# Patient Record
Sex: Female | Born: 1975
Health system: Southern US, Community
[De-identification: ages and names within clinical notes are randomized; demographics above are authoritative.]

## PROBLEM LIST (undated history)

## (undated) DIAGNOSIS — M2550 Pain in unspecified joint: Secondary | ICD-10-CM

## (undated) DIAGNOSIS — B001 Herpesviral vesicular dermatitis: Secondary | ICD-10-CM

## (undated) DIAGNOSIS — R2242 Localized swelling, mass and lump, left lower limb: Secondary | ICD-10-CM

## (undated) DIAGNOSIS — N939 Abnormal uterine and vaginal bleeding, unspecified: Secondary | ICD-10-CM

## (undated) DIAGNOSIS — R5383 Other fatigue: Secondary | ICD-10-CM

## (undated) DIAGNOSIS — M7989 Other specified soft tissue disorders: Secondary | ICD-10-CM

## (undated) DIAGNOSIS — R0602 Shortness of breath: Secondary | ICD-10-CM

## (undated) HISTORY — PX: ANKLE SURGERY: SHX546

## (undated) HISTORY — DX: Other specified soft tissue disorders: M79.89

## (undated) HISTORY — DX: Abnormal uterine and vaginal bleeding, unspecified: N93.9

## (undated) HISTORY — DX: Shortness of breath: R06.02

## (undated) HISTORY — DX: Localized swelling, mass and lump, left lower limb: R22.42

## (undated) HISTORY — DX: Herpesviral vesicular dermatitis: B00.1

## (undated) HISTORY — DX: Pain in unspecified joint: M25.50

## (undated) HISTORY — DX: Other fatigue: R53.83

---

## 2014-11-24 ENCOUNTER — Encounter: Payer: Self-pay | Admitting: Obstetrics and Gynecology

## 2014-12-02 ENCOUNTER — Ambulatory Visit (INDEPENDENT_AMBULATORY_CARE_PROVIDER_SITE_OTHER): Payer: BLUE CROSS/BLUE SHIELD | Admitting: Obstetrics and Gynecology

## 2014-12-02 ENCOUNTER — Encounter: Payer: Self-pay | Admitting: Obstetrics and Gynecology

## 2014-12-02 VITALS — BP 100/58 | HR 80 | Resp 16 | Ht 61.5 in | Wt 218.0 lb

## 2014-12-02 DIAGNOSIS — N939 Abnormal uterine and vaginal bleeding, unspecified: Secondary | ICD-10-CM | POA: Diagnosis not present

## 2014-12-02 DIAGNOSIS — Z124 Encounter for screening for malignant neoplasm of cervix: Secondary | ICD-10-CM

## 2014-12-02 DIAGNOSIS — Z23 Encounter for immunization: Secondary | ICD-10-CM

## 2014-12-02 DIAGNOSIS — E663 Overweight: Secondary | ICD-10-CM | POA: Diagnosis not present

## 2014-12-02 DIAGNOSIS — Z01419 Encounter for gynecological examination (general) (routine) without abnormal findings: Secondary | ICD-10-CM

## 2014-12-02 DIAGNOSIS — R5383 Other fatigue: Secondary | ICD-10-CM | POA: Diagnosis not present

## 2014-12-02 DIAGNOSIS — Z0000 Encounter for general adult medical examination without abnormal findings: Secondary | ICD-10-CM | POA: Diagnosis not present

## 2014-12-02 DIAGNOSIS — N926 Irregular menstruation, unspecified: Secondary | ICD-10-CM | POA: Diagnosis not present

## 2014-12-02 LAB — POCT URINE PREGNANCY: Preg Test, Ur: NEGATIVE

## 2014-12-02 MED ORDER — MEDROXYPROGESTERONE ACETATE 5 MG PO TABS: 5.0000 mg | ORAL_TABLET | Freq: Every day | ORAL | Status: DC

## 2014-12-02 NOTE — Progress Notes (Signed)
Patient ID: Caroline Heath, female   DOB: [birth year], 39 y.o.   MRN: [id number] 39 y.o. [id number] MarriedCaucasianF here for annual exam.  She has had irregular menses for the last 1-2 years. Cycles q 4-8 weeks x 4-14 days. Half the time she can bleed for 2 weeks. Typically goes through 4 maxi-pads a day. At times she has gone through a pad in 30 minutes. Occasional intermenstrual spotting, has happened for up to 2 weeks. Sexually active, vasectomy for contraception. No dyspareunia.   Period Duration (Days): 4-5 days  - sometimes it last 2 weeks Period Pattern: (!) Irregular Menstrual Flow: Heavy, Moderate Menstrual Control: Maxi pad Dysmenorrhea: None  Patient's last menstrual period was 09/20/2014.          Sexually active: Yes.    The current method of family planning is none.    Exercising: No.  The patient does not participate in regular exercise at present. Walks a couple of times a week.  Smoker:  no  Health Maintenance: Pap:  6 years ago per patient  History of abnormal Pap:  Yes  -repeat PAP - normal  MMG:  Never Colonoscopy:  Never BMD:   Never TDaP:  unsure Gardasil: N/A   reports that she has never smoked. She has never used smokeless tobacco. She reports that she does not drink alcohol or use illicit drugs. She does medical payment posting. Kids are 20,18 and 15. Oldest is a Holiday representativejunior in Automotive engineercollege.   History reviewed. No pertinent past medical history.  Past Surgical History  Procedure Laterality Date  . Ankle surgery Right     No current outpatient prescriptions on file.   No current facility-administered medications for this visit.    Family History  Problem Relation Age of Onset  . Hypertension Mother   . Hypertension Father   . Diabetes Father   . Cancer Maternal Grandmother     oral cancer  . Heart disease Paternal Grandmother   . Heart disease Paternal Grandfather   . Leukemia Paternal Grandfather     Review of Systems  Constitutional: Negative.   HENT:  Negative.   Eyes: Negative.   Respiratory: Negative.   Cardiovascular: Negative.   Gastrointestinal: Negative.   Endocrine: Negative.   Genitourinary: Positive for menstrual problem.       Irregular menstrual cycles Heavy menstrual cycles   Musculoskeletal: Negative.   Skin: Negative.   Allergic/Immunologic: Negative.   Neurological: Negative.   Psychiatric/Behavioral: Negative.   +fatigue  Exam:   BP 100/58 mmHg  Pulse 80  Resp 16  Ht 5' 1.5" (1.562 m)  Wt 218 lb (98.884 kg)  BMI 40.53 kg/m2  LMP 09/20/2014  Weight change: @WEIGHTCHANGE @ Height:   Height: 5' 1.5" (156.2 cm)  Ht Readings from Last 3 Encounters:  12/02/14 5' 1.5" (1.562 m)    General appearance: alert, cooperative and appears stated age Head: Normocephalic, without obvious abnormality, atraumatic Neck: no adenopathy, supple, symmetrical, trachea midline and thyroid normal to inspection and palpation Lungs: clear to auscultation bilaterally Breasts: normal appearance, no masses or tenderness Heart: regular rate and rhythm Abdomen: soft, non-tender; bowel sounds normal; no masses,  no organomegaly Extremities: extremities normal, atraumatic, no cyanosis or edema Skin: Skin color, texture, turgor normal. No rashes or lesions Lymph nodes: Cervical, supraclavicular, and axillary nodes normal. No abnormal inguinal nodes palpated Neurologic: Grossly normal   Pelvic: External genitalia:  no lesions              Urethra:  normal appearing urethra  with no masses, tenderness or lesions              Bartholins and Skenes: normal                 Vagina: normal appearing vagina with normal color and discharge, no lesions              Cervix: no lesions               Bimanual Exam:  Uterus:  normal size, contour, position, consistency, mobility, non-tender and exam slightly limited by BMI              Adnexa: no mass, fullness, tenderness               Rectovaginal: Confirms               Anus:  normal sphincter  tone, no lesions  Chaperone was present for exam.  A:  Well Woman with normal exam  Fatigue  Abnormal uterine bleeding  Overweight  Contraception is vasectomy  P:   Pap with hpv  Labs, not fasting  Provera 5 mg x 5 days. It has been 2 months since her last cycle  Return for u/s, possible sonohysterogram, possible endometrial biopsy  Mammogram  Discussed weight watchers, exercise and the weight loss clinic

## 2014-12-02 NOTE — Patient Instructions (Signed)

## 2014-12-03 LAB — LIPID PANEL
CHOL/HDL RATIO: 3.6 ratio (ref <=5.0)
CHOLESTEROL: 195 mg/dL (ref 125–200)
HDL: 54 mg/dL (ref >=46)
LDL CALC: 125 mg/dL (ref <130)
Triglycerides: 82 mg/dL (ref <150)
VLDL: 16 mg/dL (ref <30)

## 2014-12-03 LAB — COMPREHENSIVE METABOLIC PANEL
ALT: 13 U/L (ref 6–29)
AST: 14 U/L (ref 10–30)
Albumin: 3.9 g/dL (ref 3.6–5.1)
Alkaline Phosphatase: 65 U/L (ref 33–115)
BILIRUBIN TOTAL: 0.4 mg/dL (ref 0.2–1.2)
BUN: 20 mg/dL (ref 7–25)
CALCIUM: 9.1 mg/dL (ref 8.6–10.2)
CO2: 26 mmol/L (ref 20–31)
CREATININE: 0.82 mg/dL (ref 0.50–1.10)
Chloride: 103 mmol/L (ref 98–110)
GLUCOSE: 71 mg/dL (ref 65–99)
Potassium: 3.9 mmol/L (ref 3.5–5.3)
SODIUM: 138 mmol/L (ref 135–146)
Total Protein: 6.8 g/dL (ref 6.1–8.1)

## 2014-12-03 LAB — CBC
HCT: 39.8 % (ref 36.0–46.0)
Hemoglobin: 12.8 g/dL (ref 12.0–15.0)
MCH: 28.3 pg (ref 26.0–34.0)
MCHC: 32.2 g/dL (ref 30.0–36.0)
MCV: 88.1 fL (ref 78.0–100.0)
MPV: 12.1 fL (ref 8.6–12.4)
PLATELETS: 270 10*3/uL (ref 150–400)
RBC: 4.52 MIL/uL (ref 3.87–5.11)
RDW: 14.3 % (ref 11.5–15.5)
WBC: 5.7 10*3/uL (ref 4.0–10.5)

## 2014-12-03 LAB — PROLACTIN: Prolactin: 5.8 ng/mL

## 2014-12-03 LAB — HEMOGLOBIN A1C
Hgb A1c MFr Bld: 5.6 % (ref <5.7)
Mean Plasma Glucose: 114 mg/dL (ref <117)

## 2014-12-03 LAB — TSH: TSH: 2.388 u[IU]/mL (ref 0.350–4.500)

## 2014-12-03 LAB — VITAMIN D 25 HYDROXY (VIT D DEFICIENCY, FRACTURES): VIT D 25 HYDROXY: 30 ng/mL (ref 30–100)

## 2014-12-06 LAB — IPS PAP TEST WITH HPV

## 2014-12-07 ENCOUNTER — Ambulatory Visit: Payer: BLUE CROSS/BLUE SHIELD | Admitting: Obstetrics and Gynecology

## 2014-12-21 ENCOUNTER — Encounter: Payer: Self-pay | Admitting: Obstetrics and Gynecology

## 2014-12-21 ENCOUNTER — Ambulatory Visit (INDEPENDENT_AMBULATORY_CARE_PROVIDER_SITE_OTHER): Payer: BLUE CROSS/BLUE SHIELD | Admitting: Obstetrics and Gynecology

## 2014-12-21 ENCOUNTER — Ambulatory Visit (INDEPENDENT_AMBULATORY_CARE_PROVIDER_SITE_OTHER): Payer: BLUE CROSS/BLUE SHIELD

## 2014-12-21 ENCOUNTER — Other Ambulatory Visit: Payer: Self-pay | Admitting: Obstetrics and Gynecology

## 2014-12-21 DIAGNOSIS — N939 Abnormal uterine and vaginal bleeding, unspecified: Secondary | ICD-10-CM

## 2014-12-21 NOTE — Addendum Note (Signed)
Addended by: Tobi BastosJERTSON, [name] E on: 12/21/2014 04:49 PM   Modules accepted: Orders

## 2014-12-21 NOTE — Patient Instructions (Signed)
Intrauterine Device Insertion Most often, an intrauterine device (IUD) is inserted into the uterus to prevent pregnancy. There are 2 types of IUDs available:  Copper IUD--This type of IUD creates an environment that is not favorable to sperm survival. The mechanism of action of the copper IUD is not known for certain. It can stay in place for 10 years.  Hormone IUD--This type of IUD contains the hormone progestin (synthetic progesterone). The progestin thickens the cervical mucus and prevents sperm from entering the uterus, and it also thins the uterine lining. There is no evidence that the hormone IUD prevents implantation. One hormone IUD can stay in place for up to 5 years, and a different hormone IUD can stay in place for up to 3 years. An IUD is the most cost-effective birth control if left in place for the full duration. It may be removed at any time. LET YOUR HEALTH CARE PROVIDER KNOW ABOUT:  Any allergies you have.  All medicines you are taking, including vitamins, herbs, eye drops, creams, and over-the-counter medicines.  Previous problems you or members of your family have had with the use of anesthetics.  Any blood disorders you have.  Previous surgeries you have had.  Possibility of pregnancy.  Medical conditions you have. RISKS AND COMPLICATIONS  Generally, intrauterine device insertion is a safe procedure. However, as with any procedure, complications can occur. Possible complications include:  Accidental puncture (perforation) of the uterus.  Accidental placement of the IUD either in the muscle layer of the uterus (myometrium) or outside the uterus. If this happens, the IUD can be found essentially floating around the bowels and must be taken out surgically.  The IUD may fall out of the uterus (expulsion). This is more common in women who have recently had a child.   Pregnancy in the fallopian tube (ectopic).  Pelvic inflammatory disease (PID), which is infection of  the uterus and fallopian tubes. The risk of PID is slightly increased in the first 20 days after the IUD is placed, but the overall risk is still very low. BEFORE THE PROCEDURE  Schedule the IUD insertion for when you will have your menstrual period or right after, to make sure you are not pregnant. Placement of the IUD is better tolerated shortly after a menstrual cycle.  You may need to take tests or be examined to make sure you are not pregnant.  You may be required to take a pregnancy test.  You may be required to get checked for sexually transmitted infections (STIs) prior to placement. Placing an IUD in someone who has an infection can make the infection worse.  You may be given a pain reliever to take 1 or 2 hours before the procedure.  An exam will be performed to determine the size and position of your uterus.  Ask your health care provider about changing or stopping your regular medicines. PROCEDURE   A tool (speculum) is placed in the vagina. This allows your health care provider to see the lower part of the uterus (cervix).  The cervix is prepped with a medicine that lowers the risk of infection.  You may be given a medicine to numb each side of the cervix (intracervical or paracervical block). This is used to block and control any discomfort with insertion.  A tool (uterine sound) is inserted into the uterus to determine the length of the uterine cavity and the direction the uterus may be tilted.  A slim instrument (IUD inserter) is inserted through the cervical   canal and into your uterus.  The IUD is placed in the uterine cavity and the insertion device is removed.  The nylon string that is attached to the IUD and used for eventual IUD removal is trimmed. It is trimmed so that it lays high in the vagina, just outside the cervix. AFTER THE PROCEDURE  You may have bleeding after the procedure. This is normal. It varies from light spotting for a few days to menstrual-like  bleeding.  You may have mild cramping.   This information is not intended to replace advice given to you by your health care provider. Make sure you discuss any questions you have with your health care provider.   Document Released: 08/30/2010 Document Revised: 10/22/2012 Document Reviewed: 06/22/2012 Elsevier Interactive Patient Education 2016 Elsevier Inc.  

## 2014-12-21 NOTE — Progress Notes (Signed)
GYNECOLOGY  VISIT   HPI: 39 y.o.   Married  Caucasian  female   [phone] with Patient's last menstrual period was 12/05/2014.   here for  [facility] for abnormal uterine bleeding, irregular and heavy. Normal CBC and TSH.   GYNECOLOGIC HISTORY: Patient's last menstrual period was 12/05/2014. Contraception:Vastectomy  Menopausal hormone therapy: None        OB History    Gravida Para Term Preterm AB TAB SAB Ectopic Multiple Living   There are no active problems to display for this patient.   History reviewed. No pertinent past medical history.  Past Surgical History  Procedure Laterality Date  . Ankle surgery Right     No current outpatient prescriptions on file.   No current facility-administered medications for this visit.     ALLERGIES: Review of patient's allergies indicates no known allergies.  Family History  Problem Relation Age of Onset  . Hypertension Mother   . Hypertension Father   . Diabetes Father   . Cancer Maternal Grandmother     oral cancer  . Heart disease Paternal Grandmother   . Heart disease Paternal Grandfather   . Leukemia Paternal Grandfather     Social History   Social History  . Marital Status: Married    Spouse Name: N/A  . Number of Children: N/A  . Years of Education: N/A   Occupational History  . Not on file.   Social History Main Topics  . Smoking status: Never Smoker   . Smokeless tobacco: Never Used  . Alcohol Use: No  . Drug Use: No  . Sexual Activity:    Partners: Male   Other Topics Concern  . Not on file   Social History Narrative    Review of Systems  Genitourinary:       Abnormal uterine bleeding   All other systems reviewed and are negative.   PHYSICAL EXAMINATION:    BP 110/70 mmHg  Pulse 80  Resp 14  Wt 221 lb (100.245 kg)  LMP 12/05/2014    General appearance: alert, cooperative and appears stated age  Pelvic: External genitalia:  no lesions  Urethra:  normal appearing urethra with no masses, tenderness or lesions              Bartholins and Skenes: normal                 Vagina: normal appearing vagina with normal color and discharge, no lesions              Cervix: no lesions                 Sonohysterogram The procedure and risks of the procedure were reviewed with the patient, consent form was signed. A speculum was placed in the vagina and the cervix was cleansed with betadine. The sonohysterogram catheter was inserted into the uterine cavity without difficulty. Saline was infused under direct observation with the ultrasound. No intracavitary defects were noted.The catheter was removed.   The risks of endometrial biopsy were reviewed and a consent was obtained.  A speculum was placed in the vagina and the cervix was cleansed with betadine. The mini-pipelle was placed into the endometrial cavity. The uterus sounded to 9 cm. The endometrial biopsy was performed, moderate tissue was obtained. The tenaculum and speculum were removed. There were no complications.   Chaperone was present for  exam.  ASSESSMENT Abnormal uterine bleeding, normal CBC, TSH     PLAN Endometrial biopsy done Discussed OCP's and the mirena IUD, risks and side effects reviewed. No contraindications. She is thinking about the mirena, information given If she decides on the Mirena she can do it any time in her cycle, vasectomy for contraception   An After Visit Summary was printed and given to the patient.  In addition to the sonohysterogram and biopsy, 15 minutes face to face time of which over 50% was spent in counseling.

## 2014-12-23 ENCOUNTER — Encounter: Payer: Self-pay | Admitting: Obstetrics and Gynecology

## 2014-12-27 ENCOUNTER — Telehealth: Payer: Self-pay | Admitting: Emergency Medicine

## 2014-12-27 NOTE — Telephone Encounter (Signed)
Patient sent two mychart messages.  Responded to patient in message.  Telephone encounter created and sent to Dr. Oscar LaJertson.

## 2014-12-27 NOTE — Telephone Encounter (Signed)
Mychart message to Dr. Oscar LaJertson with patient request to start on OCP.

## 2014-12-27 NOTE — Telephone Encounter (Signed)
Chief Complaint  Patient presents with  . Medication Management    Patient sent mychart message    ===View-only below this line===   ----- Message -----    From: Jenene SlickerGANTT,Hue    Sent: 12/23/2014 10:37 AM EST      To: Romualdo BolkJill Evelyn Jertson, MD Subject: Visit Follow-Up Question  Dr. Oscar LaJertson said that she could prescribe me a birth control pill to help with my periods.  I would like to try some if she could call in to [facility].  Thanks!  Also if there are birth control that help with weight loss, if I could get that prescribed and not one that will make you gain weight. Thanks!

## 2014-12-28 MED ORDER — NORETHIN ACE-ETH ESTRAD-FE 1-20 MG-MCG PO TABS: 1.0000 | ORAL_TABLET | Freq: Every day | ORAL | Status: DC

## 2014-12-28 NOTE — Telephone Encounter (Signed)
Message left to return call to Lynesha Bango at [phone].    

## 2014-12-28 NOTE — Telephone Encounter (Signed)
I've sent in a 3 month script for Loestrin 1/20 for her. Please tell her as many women loose weight as gain weight on the low dose pills. Please set up a f/u appointment with me in 3 months. She should start the pill on the first day of her cycle.

## 2015-01-18 NOTE — Telephone Encounter (Signed)
Message left to return call to Tracy at [phone].    

## 2015-03-08 ENCOUNTER — Telehealth: Payer: Self-pay | Admitting: Emergency Medicine

## 2015-03-08 NOTE — Telephone Encounter (Signed)
Call to patient. She was to schedule 3 month follow up with Dr. Oscar La after starting [facility]  Patient states she has not started pills and was not aware that they were called in to her.  I advised her that I attempted to reach her multiple times after rx sent and was unable to reach her.  Patient would like to start pills now and aware of instructions. She will call back to schedule follow up or with any concerns.. Vasectomy for contraception.

## 2015-03-29 ENCOUNTER — Other Ambulatory Visit: Payer: Self-pay | Admitting: Obstetrics and Gynecology

## 2015-03-29 ENCOUNTER — Telehealth: Payer: Self-pay

## 2015-03-29 ENCOUNTER — Encounter: Payer: Self-pay | Admitting: Obstetrics and Gynecology

## 2015-03-29 ENCOUNTER — Other Ambulatory Visit: Payer: Self-pay | Admitting: Obstetrics & Gynecology

## 2015-03-29 MED ORDER — NORETHIN ACE-ETH ESTRAD-FE 1-20 MG-MCG PO TABS: 1.0000 | ORAL_TABLET | Freq: Every day | ORAL | Status: DC

## 2015-03-29 NOTE — Telephone Encounter (Signed)
Visit Follow-Up Question  Message [phone]   From  Jenene Slickeronya Pautler   To  Romualdo BolkJill Evelyn Jertson, MD   Sent  03/29/2015 5:00 PM     I never filled the birth control pill, I didn't know it was called in. The pharmacy does not have it. I was wanting to know if you could call in again for me to [facility]. I spoke with last week and she said if the pharmacy didn't have it to let you know. Thanks! Caroline Heath      Responsible Party    Pool - [facility] Pool No one has taken responsibility for this message.     No actions have been taken on this message.     Rx for Loestrin Fe 1-20 on 12/28/2014. Patient states that she was unaware that this rx was sent in to the pharmacy. Per note from Almedia Ballsracy Fast, RN on 03/08/2015 and 12/27/2014 she tried to reach patient multiple times to notify her this rx had been sent in without a return call. Is it okay to send in 3 month supply of Loestrin Fe 1-20 to pharmacy on file for patient at this time?

## 2015-03-29 NOTE — Telephone Encounter (Signed)
Telephone encounter created to discuss mychart message with Dr.Jertson. 

## 2015-03-29 NOTE — Telephone Encounter (Signed)
Rx sent electronically with message to pt.  Ok to close encounter.

## 2015-10-03 ENCOUNTER — Telehealth: Payer: Self-pay | Admitting: Obstetrics and Gynecology

## 2015-10-03 NOTE — Telephone Encounter (Signed)
Patient has been on her cycle for three weeks and is asking if she could get a prescription to help with this issue?

## 2015-10-03 NOTE — Telephone Encounter (Signed)
I agree

## 2015-10-03 NOTE — Telephone Encounter (Signed)
Left message to call Artemisia Auvil at [phone].  

## 2015-10-03 NOTE — Telephone Encounter (Signed)
Spoke with patient.Patient reports cycle started 3 weeks, 2 days ago. Patient states bleeding will "ease up" gets "worse" but never light. Patient states multiple clots throughout cycle.  Denies cramping. Patient states she is changing a heavy flow pad every 2 hours. Patient reports no longer taking OCP. Advised patient she would need to come into office for further evaluation with Dr. Oscar LaJertson. Patient scheduled for 8:15am, 10/04/15 with Dr. Oscar LaJertson. Last seen in office for AUB 12/21/2014. AEX scheduled for 12/07/15. Patient agreeable to date and time.   Dr. Oscar LaJertson, do you agree with these recommendations?

## 2015-10-04 ENCOUNTER — Encounter: Payer: Self-pay | Admitting: Obstetrics and Gynecology

## 2015-10-04 ENCOUNTER — Telehealth: Payer: Self-pay | Admitting: Obstetrics and Gynecology

## 2015-10-04 ENCOUNTER — Ambulatory Visit (INDEPENDENT_AMBULATORY_CARE_PROVIDER_SITE_OTHER): Payer: BLUE CROSS/BLUE SHIELD | Admitting: Obstetrics and Gynecology

## 2015-10-04 VITALS — BP 100/68 | HR 76 | Resp 14 | Wt 229.0 lb

## 2015-10-04 DIAGNOSIS — N858 Other specified noninflammatory disorders of uterus: Secondary | ICD-10-CM | POA: Diagnosis not present

## 2015-10-04 DIAGNOSIS — N939 Abnormal uterine and vaginal bleeding, unspecified: Secondary | ICD-10-CM

## 2015-10-04 LAB — CBC
HEMATOCRIT: 36.4 % (ref 35.0–45.0)
HEMOGLOBIN: 11.8 g/dL (ref 11.7–15.5)
MCH: 28.4 pg (ref 27.0–33.0)
MCHC: 32.4 g/dL (ref 32.0–36.0)
MCV: 87.5 fL (ref 80.0–100.0)
MPV: 11.6 fL (ref 7.5–12.5)
Platelets: 262 10*3/uL (ref 140–400)
RBC: 4.16 MIL/uL (ref 3.80–5.10)
RDW: 14 % (ref 11.0–15.0)
WBC: 5.6 10*3/uL (ref 3.8–10.8)

## 2015-10-04 LAB — POCT URINE PREGNANCY: Preg Test, Ur: NEGATIVE

## 2015-10-04 LAB — FERRITIN: Ferritin: 20 ng/mL (ref 10–232)

## 2015-10-04 LAB — TSH: TSH: 2.73 mIU/L

## 2015-10-04 MED ORDER — MEDROXYPROGESTERONE ACETATE 10 MG PO TABS: ORAL_TABLET | ORAL | 0 refills | Status: DC

## 2015-10-04 MED ORDER — NORETHIN ACE-ETH ESTRAD-FE 1-20 MG-MCG PO TABS: ORAL_TABLET | ORAL | 0 refills | Status: DC

## 2015-10-04 NOTE — Telephone Encounter (Signed)
Spoke with Talbert ForestShirley at [facility]. Clarified direction for Provera 10 mg 1 tab po BID until bleeding stops, then 1 tablet po qd x 10 days #20 0RF. Talbert ForestShirley is agreeable and will fil rx at this time.  Routing to provider for final review. Patient agreeable to disposition. Will close encounter.

## 2015-10-04 NOTE — Telephone Encounter (Signed)
Patient's pharmacy called for clarification on Provera instructions.

## 2015-10-04 NOTE — Patient Instructions (Addendum)
Endometrial Biopsy Post-procedure Instructions . Cramping is common.  You may take Ibuprofen, Aleve, or Tylenol for the cramping.  This should resolve within 24 hours.   . You may have a small amount of spotting.  You should wear a mini pad for the next few days. . You may have intercourse in 24 hours. . You need to call the office if you have any pelvic pain, fever, heavy bleeding, or foul smelling vaginal discharge. . Shower or bathe as normal . You will be notified within one week of your biopsy results or we will discuss your results at your follow-up appointment if needed.   Oral Contraception Information Oral contraceptive pills (OCPs) are medicines taken to prevent pregnancy. OCPs work by preventing the ovaries from releasing eggs. The hormones in OCPs also cause the cervical mucus to thicken, preventing the sperm from entering the uterus. The hormones also cause the uterine lining to become thin, not allowing a fertilized egg to attach to the inside of the uterus. OCPs are highly effective when taken exactly as prescribed. However, OCPs do not prevent sexually transmitted diseases (STDs). Safe sex practices, such as using condoms along with the pill, can help prevent STDs.  Before taking the pill, you may have a physical exam and Pap test. Your health care provider may order blood tests. The health care provider will make sure you are a good candidate for oral contraception. Discuss with your health care provider the possible side effects of the OCP you may be prescribed. When starting an OCP, it can take 2 to 3 months for the body to adjust to the changes in hormone levels in your body.  TYPES OF ORAL CONTRACEPTION  The combination pill--This pill contains estrogen and progestin (synthetic progesterone) hormones. The combination pill comes in 21-day, 28-day, or 91-day packs. Some types of combination pills are meant to be taken continuously (365-day pills). With 21-day packs, you do not take  pills for 7 days after the last pill. With 28-day packs, the pill is taken every day. The last 7 pills are without hormones. Certain types of pills have more than 21 hormone-containing pills. With 91-day packs, the first 84 pills contain both hormones, and the last 7 pills contain no hormones or contain estrogen only.  The minipill--This pill contains the progesterone hormone only. The pill is taken every day continuously. It is very important to take the pill at the same time each day. The minipill comes in packs of 28 pills. All 28 pills contain the hormone.  ADVANTAGES OF ORAL CONTRACEPTIVE PILLS  Decreases premenstrual symptoms.   Treats menstrual period cramps.   Regulates the menstrual cycle.   Decreases a heavy menstrual flow.   May treatacne, depending on the type of pill.   Treats abnormal uterine bleeding.   Treats polycystic ovarian syndrome.   Treats endometriosis.   Can be used as emergency contraception.  THINGS THAT CAN MAKE ORAL CONTRACEPTIVE PILLS LESS EFFECTIVE OCPs can be less effective if:   You forget to take the pill at the same time every day.   You have a stomach or intestinal disease that lessens the absorption of the pill.   You take OCPs with other medicines that make OCPs less effective, such as antibiotics, certain HIV medicines, and some seizure medicines.   You take expired OCPs.   You forget to restart the pill on day 7, when using the packs of 21 pills.  RISKS ASSOCIATED WITH ORAL CONTRACEPTIVE PILLS  Oral contraceptive pills  can sometimes cause side effects, such as:  Headache.  Nausea.  Breast tenderness.  Irregular bleeding or spotting. Combination pills are also associated with a small increased risk of:  Blood clots.  Heart attack.  Stroke.   This information is not intended to replace advice given to you by your health care provider. Make sure you discuss any questions you have with your health care provider.    Document Released: 03/24/2002 Document Revised: 10/22/2012 Document Reviewed: 06/22/2012 Elsevier Interactive Patient Education [employer]

## 2015-10-04 NOTE — Progress Notes (Signed)
GYNECOLOGY  VISIT   HPI: 40 y.o.   Married  Caucasian  female   [phone] with Patient's last menstrual period was 09/11/2015.   here c/o  Irregular menstrual bleeding.  The patient was evaluated in 12/16 for irregular heavy cycles. She had a negative biopsy, normal sonohysterogram, normal TSH and CBC. At that time we discussed OCP's and the mirena IUD (vasectomy for contraception). Menses typically q month, can skip a cycle. She skipped July. Then she had one the end of July, lasted for a week (normal). She started bleeding August 27 and is still bleeding. Heavy every day. She is saturating a pad every 2 hours (thick ones). Not light headed or dizzy. Just occasional mild cramps. She is gaining weight, up 8 lbs in 6 months.   GYNECOLOGIC HISTORY: Patient's last menstrual period was 09/11/2015. Contraception:Vasectomy  Menopausal hormone therapy: None        OB History    Gravida Para Term Preterm AB Living   3 3 3     3    SAB TAB Ectopic Multiple Live Births           3         There are no active problems to display for this patient.   Past Medical History:  Diagnosis Date  . Abnormal uterine bleeding     Past Surgical History:  Procedure Laterality Date  . ANKLE SURGERY Right     No current outpatient prescriptions on file.   No current facility-administered medications for this visit.      ALLERGIES: Review of patient's allergies indicates no known allergies.  Family History  Problem Relation Age of Onset  . Hypertension Mother   . Hypertension Father   . Diabetes Father   . Cancer Maternal Grandmother     oral cancer  . Heart disease Paternal Grandmother   . Heart disease Paternal Grandfather   . Leukemia Paternal Grandfather     Social History   Social History  . Marital status: Married    Spouse name: N/A  . Number of children: N/A  . Years of education: N/A   Occupational History  . Not on file.   Social History Main Topics  . Smoking status: Never  Smoker  . Smokeless tobacco: Never Used  . Alcohol use No  . Drug use: No  . Sexual activity: Yes    Partners: Male   Other Topics Concern  . Not on file   Social History Narrative  . No narrative on file    Review of Systems  Constitutional: Negative.   HENT: Negative.   Eyes: Negative.   Respiratory: Negative.   Cardiovascular: Negative.   Gastrointestinal: Negative.   Genitourinary:       Menstrual problem Irregular bleeding   Musculoskeletal: Negative.   Skin: Negative.   Neurological: Negative.   Endo/Heme/Allergies: Negative.   Psychiatric/Behavioral: Negative.     PHYSICAL EXAMINATION:    BP 100/68 (BP Location: Right Arm, Patient Position: Sitting, Cuff Size: Large)   Pulse 76   Resp 14   Wt 229 lb (103.9 kg)   LMP 09/11/2015   BMI 42.57 kg/m     General appearance: alert, cooperative and appears stated age Neck: no adenopathy, supple, symmetrical, trachea midline and thyroid normal to inspection and palpation Abdomen: soft, non-tender; bowel sounds normal; no masses,  no organomegaly  Pelvic: External genitalia:  no lesions              Urethra:  normal appearing  urethra with no masses, tenderness or lesions              Bartholins and Skenes: normal                 Vagina: normal appearing vagina with normal color and discharge, no lesions              Cervix: no lesions              Bimanual Exam:  Uterus:  normal size, contour, position, consistency, mobility, non-tender              Adnexa: no mass, fullness, tenderness               The risks of endometrial biopsy were reviewed and a consent was obtained.  A speculum was placed in the vagina and the cervix was cleansed with betadine. The uterine evacuator was placed into the endometrial cavity. The uterus sounded to 8 cm. The endometrial curettagey was performed, a large amount of tissue was obtained (approximately 10 cc of material). The speculum was removed. There were no complications.     Chaperone was present for exam.  ASSESSMENT Abnormal uterine bleeding. Bleeding for the last 3.5 weeks, suspect anovulatory bleeding.     PLAN UPT negative [facility] CBC, Ferritin, TSH Start provera 10 mg x 10 days Would recommend she return for a mirena IUD or start on OCP's After discussion would like to start on OCP's, no contraindications, risks reviewed.    An After Visit Summary was printed and given to the patient.  25 minutes face to face time of which over 50% was spent in counseling.

## 2015-10-19 ENCOUNTER — Encounter: Payer: Self-pay | Admitting: Obstetrics and Gynecology

## 2015-10-20 ENCOUNTER — Telehealth: Payer: Self-pay

## 2015-10-20 NOTE — Telephone Encounter (Signed)
Telephone encounter created to discuss MyChart message with patient. Sent to Dr.Jertson for review. Please see encounter dated 10/20/2015.

## 2015-10-20 NOTE — Telephone Encounter (Signed)
Spoke with patient. Advised of message as seen below form Dr.Jertson. Patient is agreeable and verbalizes understanding.  Routing to provider for final review. Patient agreeable to disposition. Will close encounter.  

## 2015-10-20 NOTE — Telephone Encounter (Signed)
Spoke with patient regarding MyChart message as seen below. Patient was seen in the office on 10/04/2015 for AUB and was started on Provera 10 mg x 10 days. Patient reports she took provera 10 mg daily for 10 days and her bleeding did not stop. On day 11 she started taking Loestrin Fe 1-20. Patient is in her first week of her pill pack and is still having bleeding. Reports she is changing her pad twice per day and is intermittently passing clots. Denies any SOB, fatigue, or dizziness. "It is a lot better than it was, but I am still having bleeding." Patient is asking when she should expect to have some relief or if she needs to be doing anything additional. Advised I will speak with Dr.Jertson and return call with further recommendations. Patient is agreeable.   Visit Follow-Up Question  Message [phone]  From Jenene Slickeronya Sigel To Romualdo BolkJill Evelyn Jertson, MD Sent 10/19/2015 12:27 PM  I took the Provera for 10 days I never stopped bleeding, so on the 11th day I started the birth control, I am still bleeding and passing clots. It has eased up but I still have to wear a pad everyday. When do you think I should get some relief?   Responsible Party   Pool - [facility] No one has taken responsibility for this message.  No actions have been taken on this message.

## 2015-10-20 NOTE — Telephone Encounter (Signed)
Her bleeding should get lighter. She may continue to have some bleeding this pill pack and may have some irregular bleeding on the next 2 packs. Tell her to take her pills at the same time every day, call if her bleeding is getting heavy, and to hang in there, it will get better.

## 2015-11-01 ENCOUNTER — Telehealth: Payer: Self-pay | Admitting: *Deleted

## 2015-11-01 ENCOUNTER — Encounter: Payer: Self-pay | Admitting: Obstetrics and Gynecology

## 2015-11-01 NOTE — Telephone Encounter (Signed)
Left message to call Caroline LarssonJill at [phone].      From Caroline Slickeronya Heath To Caroline BolkJill Evelyn Jertson, MD Sent 11/01/2015 11:48 AM  Dr. Oscar LaJertson ask me to message back if my period got heavier. It is like a normal period, passing more clots and changing pads every hour. It is not as heavy as when I saw her but it is like a normal period, I am still taking the birth control pills at the same time everyday. Is there anything else that can be done?

## 2015-11-02 NOTE — Telephone Encounter (Signed)
Spoke with patient. Patient states "bleeding has slowed down since last telephone call".  Patient states changing pad q2h. Patient states bleeding is heavy, but not as much as "initial visit" dated 10/04/15. Patient declined appointment for today and this week due to work. Patient states she can not come in until next week. Patient scheduled for 11/07/15 at 8:15 am with Dr. Oscar LaJertson. Advised should bleeding become heavier, SHOB, fatigue, dizziness; patient to return call to office or [facility]. Advised patient would review with Dr. Oscar LaJertson for additional recommendations and return call. Patient verbalizes understanding and is agreeable.     Dr. Oscar LaJertson, any additional recommendations?

## 2015-11-02 NOTE — Telephone Encounter (Signed)
Patient returned call

## 2015-11-02 NOTE — Telephone Encounter (Signed)
Please check where she is in her pill pack, she should be towards the end of her pack.  I agree with your advice.  She was supposed to be on the provera for 10 days without bleeding, but never stopped bleeding. Typically after finishing the provera a cycle will start and then you start the pill. She is still bleeding out from her previously thickened lining. It should improve on her second pack of pills.

## 2015-11-02 NOTE — Telephone Encounter (Signed)
Left message to call Brinae Woods at [phone].  

## 2015-11-02 NOTE — Telephone Encounter (Signed)
Spoke with patient. Returning call in reference to MyChart message as seen below. Patient states she has been bleeding for 2 months. Patient states she initially took Provera 10mg  for 10 days, then switched to Loestrin Fe 1-20 (see telephone encounter dated 10/20/15) patient states she has been taking daily at the same time every day. Patient states she is changing pad every 45 min. Denies fatigue, SHOB, or dizziness. Patient reports mild abdominal cramping that is relieved with Midol. Advised patient she would need to come in for OV for further evaluation. Offered multiple appointment times for today, patient states she will need to see if she can come today, will return call to office.

## 2015-11-03 NOTE — Telephone Encounter (Signed)
Spoke with patient. Patient states she is in week 3 of pills. Advised of Dr. Salli QuarryJertson's recommendations as seen below. Patient expressed her frustration and  states she just wants bleeding to stop now after 2 months. Patient will keep 11/07/15 appt with Dr. Oscar LaJertson. Advised to call if symptoms worsen or for questions.    Routing to provider for final review. Patient is agreeable to disposition. Will close encounter.

## 2015-11-07 ENCOUNTER — Ambulatory Visit (INDEPENDENT_AMBULATORY_CARE_PROVIDER_SITE_OTHER): Payer: BLUE CROSS/BLUE SHIELD | Admitting: Obstetrics and Gynecology

## 2015-11-07 ENCOUNTER — Encounter: Payer: Self-pay | Admitting: Obstetrics and Gynecology

## 2015-11-07 VITALS — BP 110/70 | HR 72 | Resp 16 | Wt 228.0 lb

## 2015-11-07 DIAGNOSIS — N939 Abnormal uterine and vaginal bleeding, unspecified: Secondary | ICD-10-CM | POA: Diagnosis not present

## 2015-11-07 DIAGNOSIS — N858 Other specified noninflammatory disorders of uterus: Secondary | ICD-10-CM | POA: Diagnosis not present

## 2015-11-07 LAB — CBC
HEMATOCRIT: 36.2 % (ref 35.0–45.0)
Hemoglobin: 11.5 g/dL — ABNORMAL LOW (ref 11.7–15.5)
MCH: 28 pg (ref 27.0–33.0)
MCHC: 31.8 g/dL — ABNORMAL LOW (ref 32.0–36.0)
MCV: 88.3 fL (ref 80.0–100.0)
MPV: 12.1 fL (ref 7.5–12.5)
Platelets: 279 10*3/uL (ref 140–400)
RBC: 4.1 MIL/uL (ref 3.80–5.10)
RDW: 14 % (ref 11.0–15.0)
WBC: 6.5 10*3/uL (ref 3.8–10.8)

## 2015-11-07 LAB — FERRITIN: FERRITIN: 12 ng/mL (ref 10–232)

## 2015-11-07 LAB — TSH: TSH: 3.47 mIU/L

## 2015-11-07 NOTE — Progress Notes (Signed)
GYNECOLOGY  VISIT   HPI: 40 y.o.   Married  Caucasian  female   [phone] with Patient's last menstrual period was 09/02/2015.   here c/o menstrual bleeding X 2 months In 12/16 the patient was evaluated for abnormal bleeding and had a negative endometrial biopsy, a normal sonohysterogram and a normal TSH and CBC. She declined treatment.  In 9/17 she was seen with what was c/w an anovulatory bleed.  Endometrial biopsy showed proliferative endometrium with breakdown, CBC was normal, TSH was normal. Negative UPT. She was treated with provera, bleeding got down to spotting with the provera, but never stopped. She started the pill the first day she stopped the provera and never had a w/d bleed. She was having spotting to light bleeding for the first several weeks on the pill. Then the end of last week her bleeding started getting heavier, over the weekend was very heavy.  At the worst she saturated a pad in 45 minutes. Passing big clots. Saturated through her clothes. Not light headed or dizzy. Having mild cramps.       GYNECOLOGIC HISTORY: Patient's last menstrual period was 09/02/2015. Contraception:OCP Menopausal hormone therapy: none         OB History    Gravida Para Term Preterm AB Living   3 3 3     3    SAB TAB Ectopic Multiple Live Births           3         There are no active problems to display for this patient.   Past Medical History:  Diagnosis Date  . Abnormal uterine bleeding     Past Surgical History:  Procedure Laterality Date  . ANKLE SURGERY Right     Current Outpatient Prescriptions  Medication Sig Dispense Refill  . norethindrone-ethinyl estradiol (JUNEL FE,GILDESS FE,LOESTRIN FE) 1-20 MG-MCG tablet Start on the first day of bleeding after finishing the provera 3 Package 0  . medroxyPROGESTERone (PROVERA) 10 MG tablet 1 tab po BID until bleeding stops, the 1 tablet po qd x 10 days. (Patient not taking: Reported on 11/07/2015) 20 tablet 0   No current  facility-administered medications for this visit.      ALLERGIES: Review of patient's allergies indicates no known allergies.  Family History  Problem Relation Age of Onset  . Hypertension Mother   . Hypertension Father   . Diabetes Father   . Cancer Maternal Grandmother     oral cancer  . Heart disease Paternal Grandmother   . Heart disease Paternal Grandfather   . Leukemia Paternal Grandfather     Social History   Social History  . Marital status: Married    Spouse name: N/A  . Number of children: N/A  . Years of education: N/A   Occupational History  . Not on file.   Social History Main Topics  . Smoking status: Never Smoker  . Smokeless tobacco: Never Used  . Alcohol use No  . Drug use: No  . Sexual activity: Yes    Partners: Male   Other Topics Concern  . Not on file   Social History Narrative  . No narrative on file    Review of Systems  Constitutional: Negative.   HENT: Negative.   Eyes: Negative.   Respiratory: Negative.   Cardiovascular: Negative.   Gastrointestinal: Negative.   Genitourinary:       Excessive menstrual bleeding   Musculoskeletal: Negative.   Skin: Negative.   Neurological: Negative.   Endo/Heme/Allergies: Negative.  Psychiatric/Behavioral: Negative.     PHYSICAL EXAMINATION:    BP 110/70 (BP Location: Right Arm, Patient Position: Sitting, Cuff Size: Large)   Pulse 72   Resp 16   Wt 228 lb (103.4 kg)   LMP 09/02/2015   BMI 42.38 kg/m     General appearance: alert, cooperative and appears stated age Abdomen: soft, non-tender; non distended, no masses,  no organomegaly  Pelvic: External genitalia:  no lesions              Urethra:  normal appearing urethra with no masses, tenderness or lesions              Bartholins and Skenes: normal                 Vagina: normal appearing vagina with normal color and discharge, no lesions, large amount of blood in the vagina, on the chucks.               Cervix: no lesions,  actively bleeding              Bimanual Exam:  Uterus:  normal size, contour, position, consistency, mobility, non-tender              Adnexa: no mass, fullness, tenderness   The risks of endometrial biopsy were reviewed and a consent was obtained.  A speculum was placed in the vagina and the cervix was cleansed with hibacleans. A tenaculum was placed on the cervix and the uterine evacuator was placed into the endometrial cavity. The uterus sounded to 10 cm. The endometrial curettage was performed, a large amount of tissue was obtained. The tenaculum and speculum were removed. There were no complications.                 Chaperone was present for exam.  ASSESSMENT Abnormal uterine bleeding, c/w anovulatory bleed. Very heavy over the weekend and today She was treated with provera, never really had a w/d bleed secondary to starting OCP's right away    PLAN Office endometrial curettage CBC, Ferritin, TSH Continue OCP's   An After Visit Summary was printed and given to the patient.  20 minutes face to face time of which over 50% was spent in counseling.

## 2015-11-09 ENCOUNTER — Telehealth: Payer: Self-pay | Admitting: *Deleted

## 2015-11-09 NOTE — Telephone Encounter (Signed)
Left message to call regarding results -eh 

## 2015-11-09 NOTE — Telephone Encounter (Signed)
-----   Message from Romualdo BolkJill Evelyn Jertson, MD sent at 11/08/2015  6:36 PM EDT ----- Please inform the patient that her biopsy was fine and check on her.

## 2015-11-09 NOTE — Telephone Encounter (Signed)
Spoke with patient patient and went over results. She is going to start the iron. Her bleeding is much better today .- eh

## 2015-12-07 ENCOUNTER — Encounter: Payer: Self-pay | Admitting: Obstetrics and Gynecology

## 2015-12-07 ENCOUNTER — Ambulatory Visit: Payer: BLUE CROSS/BLUE SHIELD | Admitting: Obstetrics and Gynecology

## 2016-01-04 ENCOUNTER — Encounter: Payer: Self-pay | Admitting: Obstetrics and Gynecology

## 2016-01-04 ENCOUNTER — Ambulatory Visit: Payer: BLUE CROSS/BLUE SHIELD | Admitting: Obstetrics and Gynecology

## 2016-01-04 ENCOUNTER — Telehealth: Payer: Self-pay | Admitting: Obstetrics and Gynecology

## 2016-01-04 NOTE — Telephone Encounter (Signed)
Patient dnka her 3 month recheck appointment today. I was unable to leave a message on her voicemail. [facility]

## 2016-04-30 ENCOUNTER — Telehealth: Payer: Self-pay

## 2016-04-30 NOTE — Telephone Encounter (Signed)
Please see below, Dr. Drue Novel has approved Pt as new Pt and okay'ed to go ahead and see her this week. Please call to schedule appt. Thank you.

## 2016-04-30 NOTE — Telephone Encounter (Signed)
That is ok, I have many openings this week
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
## 2016-04-30 NOTE — Telephone Encounter (Signed)
Patient scheduled for 05/02/2016 at 3:30pm

## 2016-05-02 ENCOUNTER — Ambulatory Visit (INDEPENDENT_AMBULATORY_CARE_PROVIDER_SITE_OTHER): Payer: BLUE CROSS/BLUE SHIELD | Admitting: Internal Medicine

## 2016-05-02 ENCOUNTER — Ambulatory Visit (HOSPITAL_BASED_OUTPATIENT_CLINIC_OR_DEPARTMENT_OTHER)
Admission: RE | Admit: 2016-05-02 | Discharge: 2016-05-02 | Disposition: A | Discharge status: Home / Self Care | Payer: BLUE CROSS/BLUE SHIELD | Source: Ambulatory Visit | Attending: Internal Medicine | Admitting: Internal Medicine

## 2016-05-02 ENCOUNTER — Encounter: Payer: Self-pay | Admitting: Internal Medicine

## 2016-05-02 VITALS — BP 130/78 | HR 77 | Temp 97.7°F | Resp 14 | Ht 62.0 in | Wt 237.2 lb

## 2016-05-02 DIAGNOSIS — M79605 Pain in left leg: Secondary | ICD-10-CM | POA: Diagnosis not present

## 2016-05-02 DIAGNOSIS — R2242 Localized swelling, mass and lump, left lower limb: Secondary | ICD-10-CM | POA: Insufficient documentation

## 2016-05-02 DIAGNOSIS — M7989 Other specified soft tissue disorders: Secondary | ICD-10-CM

## 2016-05-02 DIAGNOSIS — M1712 Unilateral primary osteoarthritis, left knee: Secondary | ICD-10-CM | POA: Insufficient documentation

## 2016-05-02 DIAGNOSIS — M76892 Other specified enthesopathies of left lower limb, excluding foot: Secondary | ICD-10-CM | POA: Diagnosis not present
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
## 2016-05-02 NOTE — Patient Instructions (Signed)
Please the x-ray at the first floor  I'll refer you to one of our sports medicine doctors

## 2016-05-02 NOTE — Progress Notes (Signed)
Pre visit review using our clinic review tool, if applicable. No additional management support is needed unless otherwise documented below in the visit note. 

## 2016-05-07 ENCOUNTER — Encounter: Payer: Self-pay | Admitting: Family Medicine

## 2016-05-07 ENCOUNTER — Ambulatory Visit (INDEPENDENT_AMBULATORY_CARE_PROVIDER_SITE_OTHER): Payer: BLUE CROSS/BLUE SHIELD | Admitting: Family Medicine

## 2016-05-07 DIAGNOSIS — R2242 Localized swelling, mass and lump, left lower limb: Secondary | ICD-10-CM

## 2016-05-07 NOTE — Assessment & Plan Note (Signed)
consistent with lipoma.  No evidence malignancy.  Knee exam is reassuring.  She is having symptoms related to this however.  We discussed conservative treatment vs general surgery referral.  She is going to think about this.  Tylenol or ibuprofen if needed for pain. F/u prn.

## 2016-05-07 NOTE — Patient Instructions (Signed)
Your ultrasound is reassuring. This is consistent with a lipoma, a benign fatty tumor. Generally these are left alone unless they are causing symptoms (which yours is). If you want to address this, let me know and we can refer you to a general surgeon. You can take tylenol, ibuprofen if needed for pain. Compression, icing are considerations but don't do anything about decreasing the size of a lipoma.
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
## 2016-05-28 ENCOUNTER — Encounter: Payer: BLUE CROSS/BLUE SHIELD | Admitting: Internal Medicine

## 2016-08-20 ENCOUNTER — Telehealth: Payer: Self-pay | Admitting: Internal Medicine

## 2016-08-20 ENCOUNTER — Encounter: Payer: BLUE CROSS/BLUE SHIELD | Admitting: Internal Medicine

## 2016-08-20 DIAGNOSIS — Z0289 Encounter for other administrative examinations: Secondary | ICD-10-CM

## 2016-08-20 NOTE — Telephone Encounter (Signed)
Patient lvm at 8:24am cancelling 3:30pm physical for today, charge or no charge

## 2016-08-20 NOTE — Telephone Encounter (Signed)
No, thx 

## 2016-09-19 ENCOUNTER — Ambulatory Visit (INDEPENDENT_AMBULATORY_CARE_PROVIDER_SITE_OTHER): Payer: BLUE CROSS/BLUE SHIELD | Admitting: Internal Medicine

## 2016-09-19 ENCOUNTER — Encounter: Payer: Self-pay | Admitting: Internal Medicine

## 2016-09-19 VITALS — BP 124/78 | HR 80 | Temp 97.7°F | Resp 14 | Ht 62.0 in | Wt 241.4 lb

## 2016-09-19 DIAGNOSIS — S81802A Unspecified open wound, left lower leg, initial encounter: Secondary | ICD-10-CM | POA: Diagnosis not present

## 2016-09-19 MED ORDER — CEPHALEXIN 500 MG PO CAPS: 500.0000 mg | ORAL_CAPSULE | Freq: Four times a day (QID) | ORAL | 0 refills | Status: DC

## 2016-09-19 NOTE — Progress Notes (Signed)
Pre visit review using our clinic review tool, if applicable. No additional management support is needed unless otherwise documented below in the visit note. 

## 2016-09-19 NOTE — Patient Instructions (Signed)
Keep the area clean and dry  Cover area  with an antibiotic ointment  Take Keflex for 5 days  Call or go to urgent care if you notice redness, swelling, discharge, fever.  Call if not back to normal in 10 days.

## 2016-09-19 NOTE — Progress Notes (Signed)
   Subjective:    Patient ID: Caroline Heath, female    DOB: 01/12/1976, 41 y.o.   MRN: [id number]  DOS:  09/19/2016 Type of visit - description : acute  Interval history:  Yesterday, she was playing with her dog, accidentally the dog scratch her left leg with the paw. She bled apparently profusely. Since then the bleeding has stopped and she has no pain but is concerned about the wound. She is going to the beach in 3 days. Her dog is healthy and vaccinated.  There was no dog saliva involved in the wound   Review of Systems No fever chills The leg is not actually swollen.  Past Medical History:  Diagnosis Date  . Abnormal uterine bleeding     Past Surgical History:  Procedure Laterality Date  . ANKLE SURGERY Right     Social History   Social History  . Marital status: Married    Spouse name: N/A  . Number of children: 3  . 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 Smoking status: Never Smoker  . Smokeless tobacco: Never Used  . Alcohol use No  . Drug use: No  . Sexual activity: Yes    Partners: Male   Other Topics Concern  . Not on file   Social History Narrative  . No narrative on file      Allergies as of 09/19/2016   No Known Allergies     Medication List       Accurate as of 09/19/16  4:38 PM. Always use your most recent med list.          ALEVE 220 MG Caps Generic drug:  Naproxen Sodium Take by mouth.          Objective:   Physical Exam BP 124/78 (BP Location: Left Arm, Patient Position: Sitting, Cuff Size: Normal)   Pulse 80   Temp 97.7 F (36.5 C) (Oral)   Resp 14   Ht 5\' 2"  (1.575 m)   Wt 241 lb 6 oz (109.5 kg)   SpO2 97%   BMI 44.15 kg/m  General:   Well developed, well nourished . NAD.  HEENT:  Normocephalic . Face symmetric, atraumatic Skin: Has a superficial injury at the left leg, actually is on top of the large, well known, leg mass. No actual bleeding, on palpation  I did not feel any FB or fluctuance. Rest of the leg actually is normal without swelling.  Neurologic:  alert & oriented X3.  Speech normal, gait appropriate for age and unassisted Psych--  Cognition and judgment appear intact.  Cooperative with normal attention span and concentration.  Behavior appropriate. No anxious or depressed appearing.        Assessment & Plan:   41 year old lady presents with a superficial injury, left leg. This was cause by her dogs paw,I'm somewhat concerned about infection. Plan: Keep the area clean and dry, start Keflex. She is up-to-date on her tetanus shot, 2016 Instructed to seek medical attention if evidence of infection.

## 2016-09-26 ENCOUNTER — Telehealth: Payer: Self-pay

## 2016-09-26 MED ORDER — DOCOSANOL 10 % EX CREA: 1.0000 "application " | TOPICAL_CREAM | Freq: Every day | CUTANEOUS | 0 refills | Status: DC

## 2016-09-26 NOTE — Telephone Encounter (Signed)
Ok send a rx for Abreva use 5 times a day x 4-5 days

## 2016-09-26 NOTE — Telephone Encounter (Signed)
Received message from Caroline Heath- she has hx of fever blisters after to much sun exposure. She woke this morning w/ 2 fever blisters on mouth. She has previously used Abreva for them. Wondering if medication can be sent to [facility].

## 2016-09-26 NOTE — Telephone Encounter (Signed)
Rx sent. Pt informed.  

## 2016-09-27 MED ORDER — ACYCLOVIR 5 % EX OINT: 1.0000 "application " | TOPICAL_OINTMENT | CUTANEOUS | 1 refills | Status: DC

## 2016-09-27 NOTE — Telephone Encounter (Signed)
Pt informed

## 2016-09-27 NOTE — Addendum Note (Signed)
Addended by: Wanda PlumpPAZ, Emmelina Mcloughlin E on: 09/27/2016 08:34 AM   Modules accepted: Orders

## 2016-09-27 NOTE — Telephone Encounter (Addendum)
Abreva not covered by Pt's insurance this formulary year. She has been using [employer] OTC Abreva- which hasn't helped. Can you recommend something else? If so requesting [facility] be [facility] [address] [facility] [address] [place], which [place] closer to her work.

## 2016-09-27 NOTE — Telephone Encounter (Signed)
Prescription for Zovirax sent, advise patient to use for 5 days for each episode

## 2018-04-16 ENCOUNTER — Ambulatory Visit: Payer: Self-pay | Admitting: Internal Medicine

## 2018-04-16 NOTE — Telephone Encounter (Signed)
  Pt called in c/o fever and nausea.   No vomiting.   No coronavirus symptoms or exposure.   I went over the home care advice with her and instructed her to stay home until her symptoms are gone.   Also to call us back if she starts vomiting or can't get the fever down.   She's not taken anything for the fever or nausea.   She's going to take something for the symptoms from OTC.  She was agreeable to this plan. Reason for Disposition . [1] Fever AND [2] no signs of serious infection or localizing symptoms (all other triage questions negative)  Answer Assessment - Initial Assessment Questions 1. TEMPERATURE: "What is the most recent temperature?"  "How was it measured?"      103.1    Orally. 2. ONSET: "When did the fever start?"      Yesterday it was 100.4     Then went down to 99 yesterday evening.    3. SYMPTOMS: "Do you have any other symptoms besides the fever?"  (e.g., colds, headache, sore throat, earache, cough, rash, diarrhea, vomiting, abdominal pain)     I feel like something is in my throat.   It makes me cough.   When I'm laying down I can hear something in my throat.   Like I need to clear my throat. I feel nauseas.   No vomiting.   Not felt well since Monday.   Headache all day yesterday. 4. 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   I'm not in contact with pts.    None of the staff sick that I know of. 5. CONTACTS: "Does anyone else in the family have an infection?"     No 6. TREATMENT: "What have you done so far to treat this fever?" (e.g., medications)     I haven't taken anything because I feel sick.  No diarrhea. 7. IMMUNOCOMPROMISE: "Do you have of the following: diabetes, HIV positive, splenectomy, cancer chemotherapy, chronic steroid treatment, transplant patient, etc."     No chronic illnesses 8. PREGNANCY: "Is there any chance you are pregnant?" "When was your last  menstrual period?"     No.   6 months ago.    9. TRAVEL: "Have you traveled out of the country in the last month?" (e.g., travel history, exposures)     No travels or exposure to coronavirus positive people.  Protocols used: FEVER-A-AH

## 2018-04-18 ENCOUNTER — Telehealth: Payer: BLUE CROSS/BLUE SHIELD | Admitting: Family

## 2018-04-18 DIAGNOSIS — R112 Nausea with vomiting, unspecified: Secondary | ICD-10-CM

## 2018-04-18 MED ORDER — ONDANSETRON HCL 4 MG PO TABS: 4.0000 mg | ORAL_TABLET | Freq: Three times a day (TID) | ORAL | 0 refills | Status: DC | PRN

## 2018-04-18 NOTE — Progress Notes (Signed)

## 2018-05-19 ENCOUNTER — Encounter: Payer: Self-pay | Admitting: Physician Assistant

## 2018-05-19 ENCOUNTER — Telehealth: Payer: BLUE CROSS/BLUE SHIELD | Admitting: Physician Assistant

## 2018-05-19 DIAGNOSIS — R21 Rash and other nonspecific skin eruption: Secondary | ICD-10-CM

## 2018-05-19 MED ORDER — PREDNISONE 5 MG PO TABS: 5.0000 mg | ORAL_TABLET | Freq: Every day | ORAL | 0 refills | Status: DC

## 2018-05-19 NOTE — Progress Notes (Signed)
E Visit for Rash  We are sorry that you are not feeling well. Here is how we plan to help!  Ms. Blankenship,  Your rash may be due to urticaria, or hives. I have prescribed Prednisone 5 mg to help with itching and inflammation. If you develop swelling around your face, in tongue/throat, or any difficulty breathing, go to the ER  Prednisone 5 mg daily for 6 days (see taper instructions below)  Directions for 6 day taper: Day 1: 2 tablets before breakfast, 1 after both lunch & dinner and 2 at bedtime Day 2: 1 tab before breakfast, 1 after both lunch & dinner and 2 at bedtime Day 3: 1 tab at each meal & 1 at bedtime Day 4: 1 tab at breakfast, 1 at lunch, 1 at bedtime Day 5: 1 tab at breakfast & 1 tab at bedtime Day 6: 1 tab at breakfast    HOME CARE:   Take cool showers and avoid direct sunlight.  Apply cool compress or wet dressings.  Take a bath in an oatmeal bath.  Sprinkle content of one Aveeno packet under running faucet with comfortably warm water.  Bathe for 15-20 minutes, 1-2 times daily.  Pat dry with a towel. Do not rub the rash.  Use hydrocortisone cream.  Take an antihistamine like Benadryl for widespread rashes that itch.  The adult dose of Benadryl is 25-50 mg by mouth 4 times daily.  Caution:  This type of medication may cause sleepiness.  Do not drink alcohol, drive, or operate dangerous machinery while taking antihistamines.  Do not take these medications if you have prostate enlargement.  Read package instructions thoroughly on all medications that you take.  GET HELP RIGHT AWAY IF:   Symptoms don't go away after treatment.  Severe itching that persists.  If you rash spreads or swells.  If you rash begins to smell.  If it blisters and opens or develops a yellow-brown crust.  You develop a fever.  You have a sore throat.  You become short of breath.  MAKE SURE YOU:  Understand these instructions. Will watch your condition. Will get help right away if  you are not doing well or get worse.  Thank you for choosing an e-visit. Your e-visit answers were reviewed by a board certified advanced clinical practitioner to complete your personal care plan. Depending upon the condition, your plan could have included both over the counter or prescription medications. Please review your pharmacy choice. Be sure that the pharmacy you have chosen is open so that you can pick up your prescription now.  If there is a problem you may message your provider in MyChart to have the prescription routed to another pharmacy. Your safety is important to [place]. If you have drug allergies check your prescription carefully.  For the next 24 hours, you can use MyChart to ask questions about today's visit, request a non-urgent call back, or ask for a work or school excuse from your e-visit provider. You will get an email in the next two days asking about your experience. I hope that your e-visit has been valuable and will speed your recovery.     I have spent 7 min in completion and review of this note- [facility]

## 2018-05-23 ENCOUNTER — Telehealth: Payer: BLUE CROSS/BLUE SHIELD | Admitting: Family

## 2018-05-23 DIAGNOSIS — T7840XD Allergy, unspecified, subsequent encounter: Secondary | ICD-10-CM

## 2018-05-23 MED ORDER — PREDNISONE 20 MG PO TABS: 20.0000 mg | ORAL_TABLET | Freq: Every day | ORAL | 0 refills | Status: DC

## 2018-05-23 NOTE — Progress Notes (Signed)
Greater than 5 minutes, yet less than 10 minutes of time have been spent researching, coordinating, and implementing care for this patient today.  Thank you for the details you included in the comment boxes. Those details are very helpful in determining the best course of treatment for you and help [place] to provide the best care.  E Visit for Rash  We are sorry that you are not feeling well. Here is how we plan to help!    Based on what you shared with me you may have a virus or an allergic reaction.  Avoid contact with pregnant women until a diagnosis is made.  Most viral rashes are contagious (especially if a fever is present).  You can return to work or school after the rash is gone or when your doctor says it is safe to return with the rash.    I have prescribed Prednisone 20mg  daily for 5 more days. (you can add this 20mg  today to whatever dose you are finishing, to boost the level in your system and get a jumpstart on the new treatment plan)   HOME CARE:   Take cool showers and avoid direct sunlight.  Apply cool compress or wet dressings.  Take a bath in an oatmeal bath.  Sprinkle content of one Aveeno packet under running faucet with comfortably warm water.  Bathe for 15-20 minutes, 1-2 times daily.  Pat dry with a towel. Do not rub the rash.  Use hydrocortisone cream.  Take an antihistamine like Benadryl for widespread rashes that itch.  The adult dose of Benadryl is 25-50 mg by mouth 4 times daily.  Caution:  This type of medication may cause sleepiness.  Do not drink alcohol, drive, or operate dangerous machinery while taking antihistamines.  Do not take these medications if you have prostate enlargement.  Read package instructions thoroughly on all medications that you take.  GET HELP RIGHT AWAY IF:   Symptoms don't go away after treatment.  Severe itching that persists.  If you rash spreads or swells.  If you rash begins to smell.  If it blisters and opens or  develops a yellow-brown crust.  You develop a fever.  You have a sore throat.  You become short of breath.  MAKE SURE YOU:  Understand these instructions. Will watch your condition. Will get help right away if you are not doing well or get worse.  Thank you for choosing an e-visit. Your e-visit answers were reviewed by a board certified advanced clinical practitioner to complete your personal care plan. Depending upon the condition, your plan could have included both over the counter or prescription medications. Please review your pharmacy choice. Be sure that the pharmacy you have chosen is open so that you can pick up your prescription now.  If there is a problem you may message your provider in MyChart to have the prescription routed to another pharmacy. Your safety is important to [place]. If you have drug allergies check your prescription carefully.  For the next 24 hours, you can use MyChart to ask questions about today's visit, request a non-urgent call back, or ask for a work or school excuse from your e-visit provider. You will get an email in the next two days asking about your experience. I hope that your e-visit has been valuable and will speed your recovery.

## 2018-05-25 MED ORDER — HYDROXYZINE PAMOATE 25 MG PO CAPS: 25.0000 mg | ORAL_CAPSULE | Freq: Four times a day (QID) | ORAL | 0 refills | Status: DC | PRN

## 2018-05-25 NOTE — Addendum Note (Signed)
Addended by: Beau Fanny on: 05/25/2018 03:07 PM   Modules accepted: Orders

## 2018-07-25 DIAGNOSIS — M25561 Pain in right knee: Secondary | ICD-10-CM | POA: Diagnosis not present

## 2018-07-25 DIAGNOSIS — M25562 Pain in left knee: Secondary | ICD-10-CM | POA: Diagnosis not present

## 2018-11-13 ENCOUNTER — Other Ambulatory Visit: Payer: Self-pay

## 2018-11-13 DIAGNOSIS — Z1231 Encounter for screening mammogram for malignant neoplasm of breast: Secondary | ICD-10-CM

## 2018-11-25 ENCOUNTER — Ambulatory Visit (HOSPITAL_BASED_OUTPATIENT_CLINIC_OR_DEPARTMENT_OTHER): Payer: BC Managed Care – PPO

## 2018-11-26 ENCOUNTER — Telehealth: Payer: Self-pay

## 2018-11-26 MED ORDER — PREDNISONE 10 MG PO TABS: ORAL_TABLET | ORAL | 0 refills | Status: DC

## 2018-11-26 NOTE — Telephone Encounter (Signed)
Please tell her I am sorry I am for her loss. If she has alarming symptoms such as frequent hives, tongue or lip swelling needs to be seen immediately I'm sending small amount of prednisone. OTC Claritin recommended as well If symptoms continue please come to the office or call for allergy referral.

## 2018-11-26 NOTE — Telephone Encounter (Signed)
Pt informed

## 2018-11-26 NOTE — Telephone Encounter (Signed)
Pt reached out to me last night- wanted to know if PCP would send in a small/short supply of prednisone- her husband passed away in [place], [place] unexpectedly Saturday November 7- and is planning funeral arrangements. She has been having all over itching for several weeks- she can not see a rash or bumps- itching is mainly concentrated on her abdomen, she has scratched so much that she has caused some bruising in the area. I asked her if she had changed her laundry detergent, soap, shampoos, lotions, etc recently that she could remember- she denied, she did mention that she had painted her bathroom several months ago but is not allergic to latex that she knows of. Rx to [facility] if possible please. For further questions she can be reached at [phone].

## 2018-12-22 ENCOUNTER — Other Ambulatory Visit: Payer: Self-pay

## 2018-12-22 DIAGNOSIS — Z20828 Contact with and (suspected) exposure to other viral communicable diseases: Secondary | ICD-10-CM

## 2018-12-22 DIAGNOSIS — Z20822 Contact with and (suspected) exposure to covid-19: Secondary | ICD-10-CM

## 2018-12-26 DIAGNOSIS — Z20828 Contact with and (suspected) exposure to other viral communicable diseases: Secondary | ICD-10-CM | POA: Diagnosis not present

## 2018-12-26 DIAGNOSIS — J309 Allergic rhinitis, unspecified: Secondary | ICD-10-CM | POA: Diagnosis not present

## 2019-02-18 DIAGNOSIS — M79602 Pain in left arm: Secondary | ICD-10-CM | POA: Diagnosis not present

## 2019-02-18 DIAGNOSIS — M7712 Lateral epicondylitis, left elbow: Secondary | ICD-10-CM | POA: Diagnosis not present

## 2019-02-20 DIAGNOSIS — M79622 Pain in left upper arm: Secondary | ICD-10-CM | POA: Diagnosis not present

## 2019-05-01 DIAGNOSIS — M25561 Pain in right knee: Secondary | ICD-10-CM | POA: Diagnosis not present

## 2019-05-01 DIAGNOSIS — M25562 Pain in left knee: Secondary | ICD-10-CM | POA: Diagnosis not present

## 2019-05-08 DIAGNOSIS — M25562 Pain in left knee: Secondary | ICD-10-CM | POA: Diagnosis not present

## 2019-05-08 DIAGNOSIS — M25561 Pain in right knee: Secondary | ICD-10-CM | POA: Diagnosis not present

## 2019-05-15 DIAGNOSIS — M25562 Pain in left knee: Secondary | ICD-10-CM | POA: Diagnosis not present

## 2019-05-15 DIAGNOSIS — M25561 Pain in right knee: Secondary | ICD-10-CM | POA: Diagnosis not present

## 2021-02-08 ENCOUNTER — Other Ambulatory Visit (HOSPITAL_BASED_OUTPATIENT_CLINIC_OR_DEPARTMENT_OTHER): Payer: Self-pay | Admitting: Internal Medicine

## 2021-02-08 DIAGNOSIS — Z1231 Encounter for screening mammogram for malignant neoplasm of breast: Secondary | ICD-10-CM

## 2021-02-17 ENCOUNTER — Encounter (HOSPITAL_BASED_OUTPATIENT_CLINIC_OR_DEPARTMENT_OTHER): Payer: Self-pay

## 2021-02-17 ENCOUNTER — Ambulatory Visit (HOSPITAL_BASED_OUTPATIENT_CLINIC_OR_DEPARTMENT_OTHER)
Admission: RE | Admit: 2021-02-17 | Discharge: 2021-02-17 | Disposition: A | Discharge status: Home / Self Care | Payer: No Typology Code available for payment source | Source: Ambulatory Visit | Attending: Internal Medicine | Admitting: Internal Medicine

## 2021-02-17 ENCOUNTER — Other Ambulatory Visit: Payer: Self-pay

## 2021-02-17 DIAGNOSIS — Z1231 Encounter for screening mammogram for malignant neoplasm of breast: Secondary | ICD-10-CM | POA: Diagnosis not present

## 2021-02-20 ENCOUNTER — Other Ambulatory Visit (HOSPITAL_BASED_OUTPATIENT_CLINIC_OR_DEPARTMENT_OTHER): Payer: Self-pay | Admitting: Internal Medicine

## 2021-02-20 DIAGNOSIS — Z1231 Encounter for screening mammogram for malignant neoplasm of breast: Secondary | ICD-10-CM

## 2021-02-21 ENCOUNTER — Ambulatory Visit (HOSPITAL_BASED_OUTPATIENT_CLINIC_OR_DEPARTMENT_OTHER): Payer: Self-pay

## 2021-04-11 DIAGNOSIS — Z0289 Encounter for other administrative examinations: Secondary | ICD-10-CM

## 2021-04-18 ENCOUNTER — Ambulatory Visit (INDEPENDENT_AMBULATORY_CARE_PROVIDER_SITE_OTHER): Payer: No Typology Code available for payment source | Admitting: Family Medicine

## 2021-04-18 ENCOUNTER — Encounter (INDEPENDENT_AMBULATORY_CARE_PROVIDER_SITE_OTHER): Payer: Self-pay | Admitting: Family Medicine

## 2021-04-18 VITALS — BP 109/73 | HR 64 | Temp 98.1°F | Ht 62.0 in | Wt 226.0 lb

## 2021-04-18 DIAGNOSIS — E559 Vitamin D deficiency, unspecified: Secondary | ICD-10-CM

## 2021-04-18 DIAGNOSIS — E7800 Pure hypercholesterolemia, unspecified: Secondary | ICD-10-CM

## 2021-04-18 DIAGNOSIS — Z1331 Encounter for screening for depression: Secondary | ICD-10-CM

## 2021-04-18 DIAGNOSIS — Z6841 Body Mass Index (BMI) 40.0 and over, adult: Secondary | ICD-10-CM

## 2021-04-18 DIAGNOSIS — R0602 Shortness of breath: Secondary | ICD-10-CM

## 2021-04-18 DIAGNOSIS — Z9189 Other specified personal risk factors, not elsewhere classified: Secondary | ICD-10-CM

## 2021-04-18 DIAGNOSIS — R739 Hyperglycemia, unspecified: Secondary | ICD-10-CM

## 2021-04-18 DIAGNOSIS — R5383 Other fatigue: Secondary | ICD-10-CM | POA: Diagnosis not present

## 2021-04-18 DIAGNOSIS — E669 Obesity, unspecified: Secondary | ICD-10-CM

## 2021-04-19 LAB — COMPREHENSIVE METABOLIC PANEL
ALT: 14 IU/L (ref 0–32)
AST: 15 IU/L (ref 0–40)
Albumin/Globulin Ratio: 1.8 (ref 1.2–2.2)
Albumin: 4.4 g/dL (ref 3.8–4.8)
Alkaline Phosphatase: 102 IU/L (ref 44–121)
BUN/Creatinine Ratio: 22 (ref 9–23)
BUN: 18 mg/dL (ref 6–24)
Bilirubin Total: 0.3 mg/dL (ref 0.0–1.2)
CO2: 23 mmol/L (ref 20–29)
Calcium: 9.5 mg/dL (ref 8.7–10.2)
Chloride: 101 mmol/L (ref 96–106)
Creatinine, Ser: 0.83 mg/dL (ref 0.57–1.00)
Globulin, Total: 2.5 g/dL (ref 1.5–4.5)
Glucose: 89 mg/dL (ref 70–99)
Potassium: 4.6 mmol/L (ref 3.5–5.2)
Sodium: 141 mmol/L (ref 134–144)
Total Protein: 6.9 g/dL (ref 6.0–8.5)
eGFR: 88 mL/min/{1.73_m2} (ref >59)

## 2021-04-19 LAB — LIPID PANEL WITH LDL/HDL RATIO
Cholesterol, Total: 204 mg/dL — ABNORMAL HIGH (ref 100–199)
HDL: 61 mg/dL (ref >39)
LDL Chol Calc (NIH): 129 mg/dL — ABNORMAL HIGH (ref 0–99)
LDL/HDL Ratio: 2.1 ratio (ref 0.0–3.2)
Triglycerides: 77 mg/dL (ref 0–149)
VLDL Cholesterol Cal: 14 mg/dL (ref 5–40)

## 2021-04-19 LAB — CBC WITH DIFFERENTIAL/PLATELET
Basophils Absolute: 0.1 10*3/uL (ref 0.0–0.2)
Basos: 1 %
EOS (ABSOLUTE): 0.1 10*3/uL (ref 0.0–0.4)
Eos: 1 %
Hematocrit: 42.9 % (ref 34.0–46.6)
Hemoglobin: 13.5 g/dL (ref 11.1–15.9)
Immature Grans (Abs): 0 10*3/uL (ref 0.0–0.1)
Immature Granulocytes: 0 %
Lymphocytes Absolute: 1.7 10*3/uL (ref 0.7–3.1)
Lymphs: 31 %
MCH: 27.7 pg (ref 26.6–33.0)
MCHC: 31.5 g/dL (ref 31.5–35.7)
MCV: 88 fL (ref 79–97)
Monocytes Absolute: 0.4 10*3/uL (ref 0.1–0.9)
Monocytes: 7 %
Neutrophils Absolute: 3.2 10*3/uL (ref 1.4–7.0)
Neutrophils: 60 %
Platelets: 220 10*3/uL (ref 150–450)
RBC: 4.88 x10E6/uL (ref 3.77–5.28)
RDW: 13.4 % (ref 11.7–15.4)
WBC: 5.4 10*3/uL (ref 3.4–10.8)

## 2021-04-19 LAB — VITAMIN D 25 HYDROXY (VIT D DEFICIENCY, FRACTURES): Vit D, 25-Hydroxy: 50.1 ng/mL (ref 30.0–100.0)

## 2021-04-19 LAB — FOLATE: Folate: 16.3 ng/mL (ref >3.0)

## 2021-04-19 LAB — TSH: TSH: 4.65 u[IU]/mL — ABNORMAL HIGH (ref 0.450–4.500)

## 2021-04-19 LAB — T4, FREE: Free T4: 1.25 ng/dL (ref 0.82–1.77)

## 2021-04-19 LAB — T3: T3, Total: 137 ng/dL (ref 71–180)

## 2021-04-19 LAB — INSULIN, RANDOM: INSULIN: 9.8 u[IU]/mL (ref 2.6–24.9)

## 2021-04-19 LAB — HEMOGLOBIN A1C
Est. average glucose Bld gHb Est-mCnc: 117 mg/dL
Hgb A1c MFr Bld: 5.7 % — ABNORMAL HIGH (ref 4.8–5.6)

## 2021-04-19 LAB — VITAMIN B12: Vitamin B-12: 442 pg/mL (ref 232–1245)

## 2021-04-19 NOTE — Progress Notes (Signed)
? ? ? ? ?Chief Complaint:  ? ?OBESITY ?Caroline Heath (MR# [id number]) is a 46 y.o. female who presents for evaluation and treatment of obesity and related comorbidities. Current BMI is Body mass index is 41.34 kg/m?Marland Kitchen Caroline Heath has been struggling with her weight for many years and has been unsuccessful in either losing weight, maintaining weight loss, or reaching her healthy weight goal. ? 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 She previously did combo with Weight Watchers and Optivia but stopped due to not liking meals on Optivia. She is eating out 4 times per week. She skips breakfast due to lack of hunger. She drinks coffee and little creamer (between 1/2 tsp to 1 tsp). She recently started [place] Yogurt, protein powder, pineapple, blueberries, strawberries and chia seeds (hungry) (satisfied). 3 pm grilled chicken 4 ounces), vegetables, ranch dip or ham and vegetables. Supper is pasta or grilled chicken 4-5 ounces or salmon maybe 1 cup of broccoli (full). ? ?[name] is currently in the action stage of change and ready to dedicate time achieving and maintaining a healthier weight. [name] is interested in becoming our patient and working on intensive lifestyle modifications including (but not limited to) diet and exercise for weight loss. ? ?Caroline Heath's habits were reviewed today and are as follows: Her family eats meals together, she thinks her family will eat healthier with her, her desired weight loss is 41 pounds, she has been heavy most of her life, she started gaining weight when I broke my ankle, her heaviest weight ever was 248 pounds, she has significant food cravings issues, she snacks frequently in the evenings, she skips meals frequently, she is frequently drinking liquids with calories, she frequently makes poor food choices, she has problems with excessive hunger, she frequently eats larger portions than normal, and she struggles with emotional eating. ? ?Depression Screen ?Caroline Heath's Food and Mood  (modified PHQ-9) score was 8. ? ? ?  04/18/2021  ?  8:11 AM  ?Depression screen PHQ 2/9  ?Decreased Interest 2  ?Down, Depressed, Hopeless 1  ?PHQ - 2 Score 3  ?Altered sleeping 0  ?Tired, decreased energy 3  ?Change in appetite 1  ?Feeling bad or failure about yourself  0  ?Trouble concentrating 0  ?Moving slowly or fidgety/restless 1  ?Suicidal thoughts 0  ?PHQ-9 Score 8  ?Difficult doing work/chores Not difficult at all  ? ?Subjective:  ? ?1. Other fatigue ?Caroline Heath's EKG shows poor R wave progression, low voltage, normal sinus rhythm at 77 bpm.Caroline Heath admits to daytime somnolence and admits to waking up still tired. Patient has a history of symptoms of morning fatigue. Caroline Heath generally gets 6 hours of sleep per night, and states that she has difficulty falling asleep and generally restful sleep. Snoring are not present. Apneic episodes are not present. Epworth Sleepiness Score is 3.   ? ?2. Shortness of breath on exertion ?Caroline Heath's EKG shows poor R wave progression, low voltage, normal sinus rhythm at 77 bpm. Shama notes increasing shortness of breath with exercising and seems to be worsening over time with weight gain. She notes getting out of breath sooner with activity than she used to. This has not gotten worse recently. Fortunata denies shortness of breath at rest or orthopnea.  ? ?3. Vitamin D deficiency ?Caroline Heath's last Vitamin D level 2016 of 30. She is not on Vitamin D currently. She notes fatigue. ? ?4. Pure hypercholesterolemia ?Caroline Heath's last LDL was 125. Her HDL was 54. Her Triglycerides was 82. She is not on medications.  ? ?  5. Hyperglycemia ?Caroline Heath's glucose was elevated in the past. Her last A1C was > 5 years aago.  ? ?6. At risk for deficient intake of food ?The patient is at a higher than average risk of deficient intake of food due to not eating enough.  ? ?Assessment/Plan:  ? ?1. Other fatigue ?We will check EKG, IC and labs today. Caroline Heath does feel that her weight is causing her energy to be lower than it should  be. Fatigue may be related to obesity, depression or many other causes. Labs will be ordered, and in the meanwhile, [name] will focus on self care including making healthy food choices, increasing physical activity and focusing on stress reduction.  ? ?- EKG 12-Lead ?- Vitamin B12 ?- Comprehensive metabolic panel ?- Folate ?- T4, free ?- T3 ?- TSH ? ?2. Shortness of breath on exertion ?We will check EKG, IC and labs today. Caroline Heath does feel that she gets out of breath more easily that she used to when she exercises. Caroline Heath's shortness of breath appears to be obesity related and exercise induced. She has agreed to work on weight loss and gradually increase exercise to treat her exercise induced shortness of breath. Will continue to monitor closely.  ? ?- CBC with Differential/Platelet ? ?3. Vitamin D deficiency ?Low Vitamin D level contributes to fatigue and are associated with obesity, breast, and colon cancer. We will check Vitamin D level today and Caroline Heath will follow-up for routine testing of Vitamin D, at least 2-3 times per year to avoid over-replacement. ? ?- VITAMIN D 25 Hydroxy (Vit-D Deficiency, Fractures) ? ?4. Pure hypercholesterolemia ?Cardiovascular risk and specific lipid/LDL goals reviewed.  We will follow up FLP today. We discussed several lifestyle modifications today and Caroline Heath will continue to work on diet, exercise and weight loss efforts. Orders and follow up as documented in patient record.  ? ?Counseling ?Intensive lifestyle modifications are the first line treatment for this issue. ?Dietary changes: Increase soluble fiber. Decrease simple carbohydrates. ?Exercise changes: Moderate to vigorous-intensity aerobic activity 150 minutes per week if tolerated. ?Lipid-lowering medications: see documented in medical record. ? ?- Lipid Panel With LDL/HDL Ratio ? ?5. Hyperglycemia ?We will check A1C and insulin today.  ? ?- Hemoglobin A1c ?- Insulin, random ? ?6. Screening for depression ?Somer had a positive  depression screening. Depression is commonly associated with obesity and often results in emotional eating behaviors. We will monitor this closely and work on CBT to help improve the non-hunger eating patterns. Referral to Psychology may be required if no improvement is seen as she continues in our clinic.  ? ?7. At risk for deficient intake of food ?Caroline Heath was given approximately 15 minutes of deficient intake of food prevention counseling today. Caroline Heath is at risk for eating too few calories based on current food recall. She was encouraged to focus on meeting caloric and protein goals according to her recommended meal plan.  ? ?8. Obesity with current BMI of 41.5 ?Caroline Heath is currently in the action stage of change and her goal is to continue with weight loss efforts. I recommend Caroline Heath begin the structured treatment plan as follows: ? ?She has agreed to the Category 1 Plan plus 100 calories. ? ?Exercise goals: No exercise has been prescribed at this time.  ? ?Behavioral modification strategies: increasing lean protein intake, no skipping meals, meal planning and cooking strategies, and keeping healthy foods in the home. ? ?She was informed of the importance of frequent follow-up visits to maximize her success with intensive lifestyle  modifications for her multiple health conditions. She was informed we would discuss her lab results at her next visit unless there is a critical issue that needs to be addressed sooner. Caroline Heath agreed to keep her next visit at the agreed upon time to discuss these results. ? ?Objective:  ? ?Blood pressure 109/73, pulse 64, temperature 98.1 ?F (36.7 ?C), height 5\' 2"  (1.575 m), weight 226 lb (102.5 kg), SpO2 98 %. Body mass index is 41.34 kg/m?. ? ?EKG: Normal sinus rhythm, rate 77 bpm. ? ?Indirect Calorimeter completed today shows a VO2 of 180 and a REE of 1238.  Her calculated basal metabolic rate is [id number] thus her basal metabolic rate is worse than expected. ? ?General: Cooperative, alert,  well developed, in no acute distress. ?HEENT: Conjunctivae and lids unremarkable. ?Cardiovascular: Regular rhythm.  ?Lungs: Normal work of breathing. ?Neurologic: No focal deficits.  ? ?Lab Results  ?[place]

## 2021-05-02 ENCOUNTER — Encounter (INDEPENDENT_AMBULATORY_CARE_PROVIDER_SITE_OTHER): Payer: Self-pay | Admitting: Family Medicine

## 2021-05-02 ENCOUNTER — Ambulatory Visit (INDEPENDENT_AMBULATORY_CARE_PROVIDER_SITE_OTHER): Payer: No Typology Code available for payment source | Admitting: Family Medicine

## 2021-05-02 VITALS — BP 104/64 | HR 83 | Temp 98.1°F | Ht 62.0 in | Wt 223.0 lb

## 2021-05-02 DIAGNOSIS — E669 Obesity, unspecified: Secondary | ICD-10-CM | POA: Diagnosis not present

## 2021-05-02 DIAGNOSIS — R7989 Other specified abnormal findings of blood chemistry: Secondary | ICD-10-CM

## 2021-05-02 DIAGNOSIS — E7849 Other hyperlipidemia: Secondary | ICD-10-CM | POA: Diagnosis not present

## 2021-05-02 DIAGNOSIS — R7303 Prediabetes: Secondary | ICD-10-CM

## 2021-05-02 DIAGNOSIS — Z6841 Body Mass Index (BMI) 40.0 and over, adult: Secondary | ICD-10-CM

## 2021-05-02 NOTE — Telephone Encounter (Signed)
Dr.Ukleja 

## 2021-05-03 NOTE — Telephone Encounter (Signed)
Please advise 

## 2021-05-10 NOTE — Progress Notes (Signed)
Chief Complaint:   OBESITY Caroline Heath is here to discuss her progress with her obesity treatment plan along with follow-up of her obesity related diagnoses. Caroline Heath is on the Category 1 Plan plus 100 calories and states she is following her eating plan approximately 90% of the time. Caroline Heath states she is not exercising.  Today's visit was #: 2 Starting weight: 226 lbs Starting date: 04/18/2021 Today's weight: 223 lbs Today's date: 05/02/2021 Total lbs lost to date: 3 Total lbs lost since last in-office visit: 3  Interim History: Caroline Heath feels that she is hungrier between getting off work and dinner. She will use snack calories to get her hunger controlled until dinner. Caroline Heath finds dinner portions to be easy to get in. She likes the food on the plan. Caroline Heath doesn't anticipate any obstacles and wonders if there is a way to add nut butters into the plan.  Subjective:   1. Other hyperlipidemia Caroline Heath's HDL is 61, triglycerides are 77, and her LDL is 129 and she is not on a statin.10 year ASCVD risk 0.5% which is below goal risk of 0.6%.  2. Elevated TSH Caroline Heath's TSH is 4.65. T3 and T4 are within normal limits. She is not on medications and denies cold or heat intolerance or palpitations.  3. Prediabetes Caroline Heath's last A1c was 5.7 and Insulin was 9.8. She is not on medication.  Assessment/Plan:   1. Other hyperlipidemia Cardiovascular risk and specific lipid/LDL goals reviewed.  We discussed several lifestyle modifications today and Caroline Heath will continue to work on diet, exercise and weight loss efforts. Orders and follow up as documented in patient record. Labs will be repeated in 3 months.  Counseling Intensive lifestyle modifications are the first line treatment for this issue. Dietary changes: Increase soluble fiber. Decrease simple carbohydrates. Exercise changes: Moderate to vigorous-intensity aerobic activity 150 minutes per week if tolerated. Lipid-lowering medications: see documented in  medical record.  2. Elevated TSH We will repeat Caroline Heath's TSH in 1 month and she agrees to follow up as directed.  3. Prediabetes Caroline Heath will continue to work on weight loss, exercise, and decreasing simple carbohydrates to help decrease the risk of diabetes. We did discuss medications that are covered for GLP-1. It is unclear if insurance will cover. Caroline Heath will send a Mychart message if she would like to start medication.  4. Obesity with current BMI of 40.9 Caroline Heath is currently in the action stage of change. As such, her goal is to continue with weight loss efforts. She has agreed to the Category 2 Plan.   Exercise goals: No exercise has been prescribed at this time.  Behavioral modification strategies: increasing lean protein intake, meal planning and cooking strategies, and keeping healthy foods in the home.  Caroline Heath has agreed to follow-up with our clinic in 3 weeks. She was informed of the importance of frequent follow-up visits to maximize her success with intensive lifestyle modifications for her multiple health conditions.   Objective:   Blood pressure 104/64, pulse 83, temperature 98.1 F (36.7 C), height 5\' 2"  (1.575 m), weight 223 lb (101.2 kg), SpO2 99 %. Body mass index is 40.79 kg/m.  General: Cooperative, alert, well developed, in no acute distress. HEENT: Conjunctivae and lids unremarkable. Cardiovascular: Regular rhythm.  Lungs: Normal work of breathing. Neurologic: No focal deficits.   Lab Results  Component Value Date   CREATININE 0.83 04/18/2021   BUN 18 04/18/2021   NA 141 04/18/2021   K 4.6 04/18/2021   CL 101 04/18/2021  CO2 23 04/18/2021   Lab Results  Component Value Date   ALT 14 04/18/2021   AST 15 04/18/2021   ALKPHOS 102 04/18/2021   BILITOT 0.3 04/18/2021   Lab Results  Component Value Date   HGBA1C 5.7 (H) 04/18/2021   HGBA1C 5.6 12/02/2014   Lab Results  Component Value Date   INSULIN 9.8 04/18/2021   Lab Results  Component Value Date    TSH 4.650 (H) 04/18/2021   Lab Results  Component Value Date   CHOL 204 (H) 04/18/2021   HDL 61 04/18/2021   LDLCALC 129 (H) 04/18/2021   TRIG 77 04/18/2021   CHOLHDL 3.6 12/02/2014   Lab Results  Component Value Date   VD25OH 50.1 04/18/2021   VD25OH 30 12/02/2014   Lab Results  Component Value Date   WBC 5.4 04/18/2021   HGB 13.5 04/18/2021   HCT 42.9 04/18/2021   MCV 88 04/18/2021   PLT 220 04/18/2021   Lab Results  Component Value Date   FERRITIN 12 11/07/2015   Attestation Statements:   Reviewed by clinician on day of visit: allergies, medications, problem list, medical history, surgical history, family history, social history, and previous encounter notes.  IKirke Corin, CMA, am acting as transcriptionist for Reuben Likes, MD   I have reviewed the above documentation for accuracy and completeness, and I agree with the above. - Reuben Likes, MD

## 2021-05-15 ENCOUNTER — Ambulatory Visit (INDEPENDENT_AMBULATORY_CARE_PROVIDER_SITE_OTHER): Payer: No Typology Code available for payment source | Admitting: Family Medicine

## 2021-05-17 ENCOUNTER — Encounter (INDEPENDENT_AMBULATORY_CARE_PROVIDER_SITE_OTHER): Payer: Self-pay | Admitting: Family Medicine

## 2021-05-17 ENCOUNTER — Ambulatory Visit (INDEPENDENT_AMBULATORY_CARE_PROVIDER_SITE_OTHER): Payer: No Typology Code available for payment source | Admitting: Family Medicine

## 2021-05-17 VITALS — BP 103/71 | HR 71 | Temp 97.9°F | Ht 62.0 in | Wt 224.0 lb

## 2021-05-17 DIAGNOSIS — R7303 Prediabetes: Secondary | ICD-10-CM

## 2021-05-17 DIAGNOSIS — Z6841 Body Mass Index (BMI) 40.0 and over, adult: Secondary | ICD-10-CM | POA: Diagnosis not present

## 2021-05-17 DIAGNOSIS — E669 Obesity, unspecified: Secondary | ICD-10-CM | POA: Diagnosis not present

## 2021-05-17 MED ORDER — METFORMIN HCL 500 MG PO TABS: 500.0000 mg | ORAL_TABLET | Freq: Every day | ORAL | 0 refills | Status: DC

## 2021-05-31 NOTE — Progress Notes (Signed)
Chief Complaint:   OBESITY Caroline Heath is here to discuss her progress with her obesity treatment plan along with follow-up of her obesity related diagnoses. Caroline Heath is on the Category 2 Plan and states she is following her eating plan approximately 90% of the time. Caroline Heath states she is doing 0 minutes 0 times per week.  Today's visit was #: 3 Starting weight: 226 lbs Starting date: 04/18/2021 Today's weight: 224 lbs Today's date: 05/17/2021 Total lbs lost to date: 2 Total lbs lost since last in-office visit: 0  Interim History: Caroline Heath is starting to deviate from her plan more. She is skipping breakfast more and not always eating all of the protein in her dinner.  Subjective:   1. Pre-diabetes Caroline Heath notes increased polyphagia, especially with decreased protein.   Assessment/Plan:   1. Pre-diabetes Caroline Heath agreed to start metformin 500 mg q AM with food, with no refills. Handout on metformin was given today. Caroline Heath will continue to work on weight loss, exercise, and decreasing simple carbohydrates to help decrease the risk of diabetes.   - metFORMIN (GLUCOPHAGE) 500 MG tablet; Take 1 tablet (500 mg total) by mouth daily with breakfast.  Dispense: 30 tablet; Refill: 0  2. Obesity, Current BMI 41.0 Caroline Heath is currently in the action stage of change. As such, her goal is to continue with weight loss efforts. She has agreed to change to the Category 2 Plan or keeping a food journal and adhering to recommended goals of 1100-1300 calories and 80+ grams of protein daily.   Behavioral modification strategies: increasing lean protein intake and no skipping meals.  Caroline Heath has agreed to follow-up with our clinic in 2 to 3 weeks. She was informed of the importance of frequent follow-up visits to maximize her success with intensive lifestyle modifications for her multiple health conditions.   Objective:   Blood pressure 103/71, pulse 71, temperature 97.9 F (36.6 C), height 5\' 2"  (1.575 m), weight 224 lb  (101.6 kg), SpO2 99 %. Body mass index is 40.97 kg/m.  General: Cooperative, alert, well developed, in no acute distress. HEENT: Conjunctivae and lids unremarkable. Cardiovascular: Regular rhythm.  Lungs: Normal work of breathing. Neurologic: No focal deficits.   Lab Results  Component Value Date   CREATININE 0.83 04/18/2021   BUN 18 04/18/2021   NA 141 04/18/2021   K 4.6 04/18/2021   CL 101 04/18/2021   CO2 23 04/18/2021   Lab Results  Component Value Date   ALT 14 04/18/2021   AST 15 04/18/2021   ALKPHOS 102 04/18/2021   BILITOT 0.3 04/18/2021   Lab Results  Component Value Date   HGBA1C 5.7 (H) 04/18/2021   HGBA1C 5.6 12/02/2014   Lab Results  Component Value Date   INSULIN 9.8 04/18/2021   Lab Results  Component Value Date   TSH 4.650 (H) 04/18/2021   Lab Results  Component Value Date   CHOL 204 (H) 04/18/2021   HDL 61 04/18/2021   LDLCALC 129 (H) 04/18/2021   TRIG 77 04/18/2021   CHOLHDL 3.6 12/02/2014   Lab Results  Component Value Date   VD25OH 50.1 04/18/2021   VD25OH 30 12/02/2014   Lab Results  Component Value Date   WBC 5.4 04/18/2021   HGB 13.5 04/18/2021   HCT 42.9 04/18/2021   MCV 88 04/18/2021   PLT 220 04/18/2021   Lab Results  Component Value Date   FERRITIN 12 11/07/2015   Attestation Statements:   Reviewed by clinician on day of visit: allergies,  medications, problem list, medical history, surgical history, family history, social history, and previous encounter notes.  Time spent on visit including pre-visit chart review and post-visit care and charting was 35 minutes.    I, Burt Knack, am acting as transcriptionist for Quillian Quince, MD.  I have reviewed the above documentation for accuracy and completeness, and I agree with the above. -  Quillian Quince, MD

## 2021-06-08 ENCOUNTER — Ambulatory Visit (INDEPENDENT_AMBULATORY_CARE_PROVIDER_SITE_OTHER): Payer: No Typology Code available for payment source | Admitting: Family Medicine

## 2021-06-08 ENCOUNTER — Encounter (INDEPENDENT_AMBULATORY_CARE_PROVIDER_SITE_OTHER): Payer: Self-pay | Admitting: Family Medicine

## 2021-06-08 VITALS — BP 111/74 | HR 68 | Temp 98.0°F | Ht 62.0 in | Wt 225.0 lb

## 2021-06-08 DIAGNOSIS — R7303 Prediabetes: Secondary | ICD-10-CM

## 2021-06-08 DIAGNOSIS — E669 Obesity, unspecified: Secondary | ICD-10-CM

## 2021-06-08 DIAGNOSIS — Z6841 Body Mass Index (BMI) 40.0 and over, adult: Secondary | ICD-10-CM | POA: Diagnosis not present

## 2021-06-08 DIAGNOSIS — Z7984 Long term (current) use of oral hypoglycemic drugs: Secondary | ICD-10-CM

## 2021-06-08 MED ORDER — METFORMIN HCL 500 MG PO TABS: 500.0000 mg | ORAL_TABLET | Freq: Every day | ORAL | 0 refills | Status: DC

## 2021-06-20 NOTE — Progress Notes (Signed)
Chief Complaint:   OBESITY Caroline Heath is here to discuss her progress with her obesity treatment plan along with follow-up of her obesity related diagnoses. Caroline Heath is on the Category 2 Plan or keeping a food journal and adhering to recommended goals of 1100-1300 calories and 80+ grams of protein daily and states she is following her eating plan approximately 95% of the time. Caroline Heath states she is doing 0 minutes 0 times per week.  Today's visit was #: 4 Starting weight: 226 lbs Starting date: 04/18/2021 Today's weight: 225 lbs Today's date: 06/08/2021 Total lbs lost to date: 1 Total lbs lost since last in-office visit: 0  Interim History: Caroline Heath is working on her diet and she is trying to increase her protein. She is frustrated that her weight is not going down.  Subjective:   1. Pre-diabetes Caroline Heath is stable on metformin. Her polyphagia is reduced but still present.  Assessment/Plan:   1. Pre-diabetes Marc agreed to increase metformin to 500 mg BID with no refills. She will continue to work on her diet, exercise, and decreasing simple carbohydrates to help decrease the risk of diabetes.   - metFORMIN (GLUCOPHAGE) 500 MG tablet; Take 1 tablet (500 mg total) by mouth daily with breakfast.  Dispense: 30 tablet; Refill: 0  2. Obesity, Current BMI 41.3 Caroline Heath is currently in the action stage of change. As such, her goal is to continue with weight loss efforts. She has agreed to keeping a food journal and adhering to recommended goals of 1200-1300 calories and 80+ grams of protein daily.   Caroline Heath's RMR was unusually low which is inhibiting her weight loss. The hope was that increasing protein would improve her RMR, but this does not appear to be the case. She will need to increase her exercise in addition to increasing her protein.   Exercise goals: Add 15+ minutes of cardio per day. [place] [employer] videos were recommended.   Behavioral modification strategies: increasing lean protein  intake.  Caroline Heath has agreed to follow-up with our clinic in 2 to 3 weeks. She was informed of the importance of frequent follow-up visits to maximize her success with intensive lifestyle modifications for her multiple health conditions.   Objective:   Blood pressure 111/74, pulse 68, temperature 98 F (36.7 C), height 5\' 2"  (1.575 m), weight 225 lb (102.1 kg), SpO2 99 %. Body mass index is 41.15 kg/m.  General: Cooperative, alert, well developed, in no acute distress. HEENT: Conjunctivae and lids unremarkable. Cardiovascular: Regular rhythm.  Lungs: Normal work of breathing. Neurologic: No focal deficits.   Lab Results  Component Value Date   CREATININE 0.83 04/18/2021   BUN 18 04/18/2021   NA 141 04/18/2021   K 4.6 04/18/2021   CL 101 04/18/2021   CO2 23 04/18/2021   Lab Results  Component Value Date   ALT 14 04/18/2021   AST 15 04/18/2021   ALKPHOS 102 04/18/2021   BILITOT 0.3 04/18/2021   Lab Results  Component Value Date   HGBA1C 5.7 (H) 04/18/2021   HGBA1C 5.6 12/02/2014   Lab Results  Component Value Date   INSULIN 9.8 04/18/2021   Lab Results  Component Value Date   TSH 4.650 (H) 04/18/2021   Lab Results  Component Value Date   CHOL 204 (H) 04/18/2021   HDL 61 04/18/2021   LDLCALC 129 (H) 04/18/2021   TRIG 77 04/18/2021   CHOLHDL 3.6 12/02/2014   Lab Results  Component Value Date   VD25OH 50.1 04/18/2021  VD25OH 30 12/02/2014   Lab Results  Component Value Date   WBC 5.4 04/18/2021   HGB 13.5 04/18/2021   HCT 42.9 04/18/2021   MCV 88 04/18/2021   PLT 220 04/18/2021   Lab Results  Component Value Date   FERRITIN 12 11/07/2015   Attestation Statements:   Reviewed by clinician on day of visit: allergies, medications, problem list, medical history, surgical history, family history, social history, and previous encounter notes.  Time spent on visit including pre-visit chart review and post-visit care and charting was 30 minutes.    I,  Burt Knack, am acting as transcriptionist for Quillian Quince, MD.  I have reviewed the above documentation for accuracy and completeness, and I agree with the above. -  Quillian Quince, MD

## 2021-06-28 ENCOUNTER — Ambulatory Visit (INDEPENDENT_AMBULATORY_CARE_PROVIDER_SITE_OTHER): Payer: No Typology Code available for payment source | Admitting: Family Medicine

## 2021-06-28 ENCOUNTER — Encounter (INDEPENDENT_AMBULATORY_CARE_PROVIDER_SITE_OTHER): Payer: Self-pay | Admitting: Family Medicine

## 2021-06-28 VITALS — BP 105/69 | HR 79 | Temp 98.0°F | Ht 62.0 in | Wt 224.0 lb

## 2021-06-28 DIAGNOSIS — R7303 Prediabetes: Secondary | ICD-10-CM

## 2021-06-28 DIAGNOSIS — E669 Obesity, unspecified: Secondary | ICD-10-CM | POA: Diagnosis not present

## 2021-06-28 DIAGNOSIS — Z6841 Body Mass Index (BMI) 40.0 and over, adult: Secondary | ICD-10-CM | POA: Diagnosis not present

## 2021-06-28 NOTE — Progress Notes (Signed)
Chief Complaint:   OBESITY Caroline Heath is here to discuss her progress with her obesity treatment plan along with follow-up of her obesity related diagnoses. Caroline Heath is on keeping a food journal and adhering to recommended goals of 1200-1300 calories and 80+ grams of protein daily and states she is following her eating plan approximately 93% of the time. Caroline Heath states she is doing 0 minutes 0 times per week.  Today's visit was #: 5 Starting weight: 226 lbs Starting date: 04/18/2021 Today's weight: 224 lbs Today's date: 06/28/2021 Total lbs lost to date: 2 Total lbs lost since last in-office visit: 1  Interim History: Caroline Heath continues to lose weight. Her hunger is starting to increase. She will be going on vacation soon to [place] and [employer].   Subjective:   1. Pre-diabetes Caroline Heath increased metformin to BID, but she is still struggling with polyphagia. She denies nausea or vomiting.  Assessment/Plan:   1. Pre-diabetes Caroline Heath agreed to increase metformin to 500 mg TID #90, and we will refill for 1 month. will continue to work on weight loss, exercise, and decreasing simple carbohydrates to help decrease the risk of diabetes.   2. Obesity, Current BMI 41.0 Caroline Heath is currently in the action stage of change. As such, her goal is to continue with weight loss efforts. She has agreed to keeping a food journal and adhering to recommended goals of 1200-1300 calories and 80+ grams of protein daily.   Behavioral modification strategies: increasing lean protein intake.  Caroline Heath has agreed to follow-up with our clinic in 3 weeks. She was informed of the importance of frequent follow-up visits to maximize her success with intensive lifestyle modifications for her multiple health conditions.   Objective:   Blood pressure 105/69, pulse 79, temperature 98 F (36.7 C), height 5\' 2"  (1.575 m), weight 224 lb (101.6 kg), SpO2 99 %. Body mass index is 40.97 kg/m.  General: Cooperative, alert, well developed,  in no acute distress. HEENT: Conjunctivae and lids unremarkable. Cardiovascular: Regular rhythm.  Lungs: Normal work of breathing. Neurologic: No focal deficits.   Lab Results  Component Value Date   CREATININE 0.83 04/18/2021   BUN 18 04/18/2021   NA 141 04/18/2021   K 4.6 04/18/2021   CL 101 04/18/2021   CO2 23 04/18/2021   Lab Results  Component Value Date   ALT 14 04/18/2021   AST 15 04/18/2021   ALKPHOS 102 04/18/2021   BILITOT 0.3 04/18/2021   Lab Results  Component Value Date   HGBA1C 5.7 (H) 04/18/2021   HGBA1C 5.6 12/02/2014   Lab Results  Component Value Date   INSULIN 9.8 04/18/2021   Lab Results  Component Value Date   TSH 4.650 (H) 04/18/2021   Lab Results  Component Value Date   CHOL 204 (H) 04/18/2021   HDL 61 04/18/2021   LDLCALC 129 (H) 04/18/2021   TRIG 77 04/18/2021   CHOLHDL 3.6 12/02/2014   Lab Results  Component Value Date   VD25OH 50.1 04/18/2021   VD25OH 30 12/02/2014   Lab Results  Component Value Date   WBC 5.4 04/18/2021   HGB 13.5 04/18/2021   HCT 42.9 04/18/2021   MCV 88 04/18/2021   PLT 220 04/18/2021   Lab Results  Component Value Date   FERRITIN 12 11/07/2015   Attestation Statements:   Reviewed by clinician on day of visit: allergies, medications, problem list, medical history, surgical history, family history, social history, and previous encounter notes.  Time spent on visit  including pre-visit chart review and post-visit care and charting was 30 minutes.   I, Burt Knack, am acting as transcriptionist for Quillian Quince, MD.  I have reviewed the above documentation for accuracy and completeness, and I agree with the above. -  Quillian Quince, MD

## 2021-06-29 MED ORDER — METFORMIN HCL 500 MG PO TABS: 500.0000 mg | ORAL_TABLET | Freq: Three times a day (TID) | ORAL | 0 refills | Status: DC

## 2021-07-31 ENCOUNTER — Encounter (INDEPENDENT_AMBULATORY_CARE_PROVIDER_SITE_OTHER): Payer: Self-pay

## 2021-07-31 ENCOUNTER — Ambulatory Visit (INDEPENDENT_AMBULATORY_CARE_PROVIDER_SITE_OTHER): Payer: No Typology Code available for payment source | Admitting: Family Medicine

## 2021-08-07 ENCOUNTER — Ambulatory Visit (INDEPENDENT_AMBULATORY_CARE_PROVIDER_SITE_OTHER): Payer: No Typology Code available for payment source | Admitting: Family Medicine

## 2021-08-07 ENCOUNTER — Encounter (INDEPENDENT_AMBULATORY_CARE_PROVIDER_SITE_OTHER): Payer: Self-pay | Admitting: Family Medicine

## 2021-08-07 VITALS — BP 109/73 | HR 81 | Temp 98.1°F | Ht 62.0 in | Wt 229.0 lb

## 2021-08-07 DIAGNOSIS — E669 Obesity, unspecified: Secondary | ICD-10-CM | POA: Diagnosis not present

## 2021-08-07 DIAGNOSIS — Z6841 Body Mass Index (BMI) 40.0 and over, adult: Secondary | ICD-10-CM | POA: Diagnosis not present

## 2021-08-07 DIAGNOSIS — R7303 Prediabetes: Secondary | ICD-10-CM

## 2021-08-07 DIAGNOSIS — F3289 Other specified depressive episodes: Secondary | ICD-10-CM

## 2021-08-07 MED ORDER — METFORMIN HCL 500 MG PO TABS: 500.0000 mg | ORAL_TABLET | Freq: Three times a day (TID) | ORAL | 0 refills | Status: AC

## 2021-08-07 MED ORDER — BUPROPION HCL ER (SR) 150 MG PO TB12: 150.0000 mg | ORAL_TABLET | Freq: Every day | ORAL | 0 refills | Status: AC

## 2021-08-14 NOTE — Progress Notes (Unsigned)
Chief Complaint:   OBESITY Caroline Heath is here to discuss her progress with her obesity treatment plan along with follow-up of her obesity related diagnoses. Caroline Heath is on keeping a food journal and adhering to recommended goals of 1200-1300 calories and 80+ grams of protein daily and states she is following her eating plan approximately 90% of the time. Caroline Heath states she is walking for 30 minutes 3 times per week.  Today's visit was #: 6 Starting weight: 226 lbs Starting date: 04/18/2021 Today's weight: 229 lbs Today's date: 08/07/2021 Total lbs lost to date: 0 Total lbs lost since last in-office visit: 0  Interim History: Caroline Heath has been on vacation and she did some celebration eating.  She is working on getting back on track and she is frustrated with how difficult it is to lose weight.  She especially is struggling with increased cravings.  Subjective:   1. Pre-diabetes Caroline Heath is stable on metformin, and she notes decreased polyphagia.  She denies nausea or vomiting.  2. Other depression, emotional eating behaviors Caroline Heath notes increased cravings recently and she is doing more comfort eating.  She is open to looking at options to help her.  Assessment/Plan:   1. Pre-diabetes We will refill metformin for 1 month. Caroline Heath will continue to work on weight loss, exercise, and decreasing simple carbohydrates to help decrease the risk of diabetes.   - metFORMIN (GLUCOPHAGE) 500 MG tablet; Take 1 tablet (500 mg total) by mouth 3 (three) times daily.  Dispense: 90 tablet; Refill: 0  2. Other depression, emotional eating behaviors Caroline Heath agreed to start Wellbutrin SR 150 mg every morning, with no refills.  Emotional eating behavior strategies were discussed.  - buPROPion (WELLBUTRIN SR) 150 MG 12 hr tablet; Take 1 tablet (150 mg total) by mouth daily.  Dispense: 30 tablet; Refill: 0  3. Obesity, Current BMI 42.0 Caroline Heath is currently in the action stage of change. As such, her goal is to continue  with weight loss efforts. She has agreed to keeping a food journal and adhering to recommended goals of 1200-1300 calories and 80+ grams of protein daily.   Exercise goals: As is.   Behavioral modification strategies: increasing lean protein intake.  Caroline Heath has agreed to follow-up with our clinic in 3 weeks. She was informed of the importance of frequent follow-up visits to maximize her success with intensive lifestyle modifications for her multiple health conditions.   Objective:   Blood pressure 109/73, pulse 81, temperature 98.1 F (36.7 C), height 5\' 2"  (1.575 m), weight 229 lb (103.9 kg), SpO2 98 %. Body mass index is 41.88 kg/m.  General: Cooperative, alert, well developed, in no acute distress. HEENT: Conjunctivae and lids unremarkable. Cardiovascular: Regular rhythm.  Lungs: Normal work of breathing. Neurologic: No focal deficits.   Lab Results  Component Value Date   CREATININE 0.83 04/18/2021   BUN 18 04/18/2021   NA 141 04/18/2021   K 4.6 04/18/2021   CL 101 04/18/2021   CO2 23 04/18/2021   Lab Results  Component Value Date   ALT 14 04/18/2021   AST 15 04/18/2021   ALKPHOS 102 04/18/2021   BILITOT 0.3 04/18/2021   Lab Results  Component Value Date   HGBA1C 5.7 (H) 04/18/2021   HGBA1C 5.6 12/02/2014   Lab Results  Component Value Date   INSULIN 9.8 04/18/2021   Lab Results  Component Value Date   TSH 4.650 (H) 04/18/2021   Lab Results  Component Value Date   CHOL 204 (H)  04/18/2021   HDL 61 04/18/2021   LDLCALC 129 (H) 04/18/2021   TRIG 77 04/18/2021   CHOLHDL 3.6 12/02/2014   Lab Results  Component Value Date   VD25OH 50.1 04/18/2021   VD25OH 30 12/02/2014   Lab Results  Component Value Date   WBC 5.4 04/18/2021   HGB 13.5 04/18/2021   HCT 42.9 04/18/2021   MCV 88 04/18/2021   PLT 220 04/18/2021   Lab Results  Component Value Date   FERRITIN 12 11/07/2015   Attestation Statements:   Reviewed by clinician on day of visit:  allergies, medications, problem list, medical history, surgical history, family history, social history, and previous encounter notes.   I, Burt Knack, am acting as transcriptionist for Quillian Quince, MD.  I have reviewed the above documentation for accuracy and completeness, and I agree with the above. -  Quillian Quince, MD

## 2021-08-23 ENCOUNTER — Encounter (INDEPENDENT_AMBULATORY_CARE_PROVIDER_SITE_OTHER): Payer: Self-pay

## 2021-08-25 ENCOUNTER — Encounter (INDEPENDENT_AMBULATORY_CARE_PROVIDER_SITE_OTHER): Payer: Self-pay | Admitting: Family Medicine

## 2021-08-28 ENCOUNTER — Ambulatory Visit (INDEPENDENT_AMBULATORY_CARE_PROVIDER_SITE_OTHER): Payer: No Typology Code available for payment source | Admitting: Family Medicine

## 2023-06-14 ENCOUNTER — Other Ambulatory Visit: Payer: Self-pay | Admitting: Internal Medicine

## 2023-06-14 DIAGNOSIS — Z1231 Encounter for screening mammogram for malignant neoplasm of breast: Secondary | ICD-10-CM

## 2023-06-17 ENCOUNTER — Ambulatory Visit
Admission: RE | Admit: 2023-06-17 | Discharge: 2023-06-17 | Disposition: A | Discharge status: Home / Self Care | Source: Ambulatory Visit

## 2023-06-17 DIAGNOSIS — Z1231 Encounter for screening mammogram for malignant neoplasm of breast: Secondary | ICD-10-CM

## 2023-09-13 IMAGING — MG MM DIGITAL SCREENING BILAT W/ TOMO AND CAD
8 of 14 series · 8 of 40 positions shown · non-contrast
Comparison: None.

ACR Breast Density Category a: The breast tissue is almost entirely
fatty.

CLINICAL DATA: Screening.

EXAM:
DIGITAL SCREENING BILATERAL MAMMOGRAM WITH TOMOSYNTHESIS AND CAD
TECHNIQUE: Bilateral screening digital craniocaudal and mediolateral oblique
mammograms were obtained. Bilateral screening digital breast
tomosynthesis was performed. The images were evaluated with
computer-aided detection.

[R CC synth-2D]
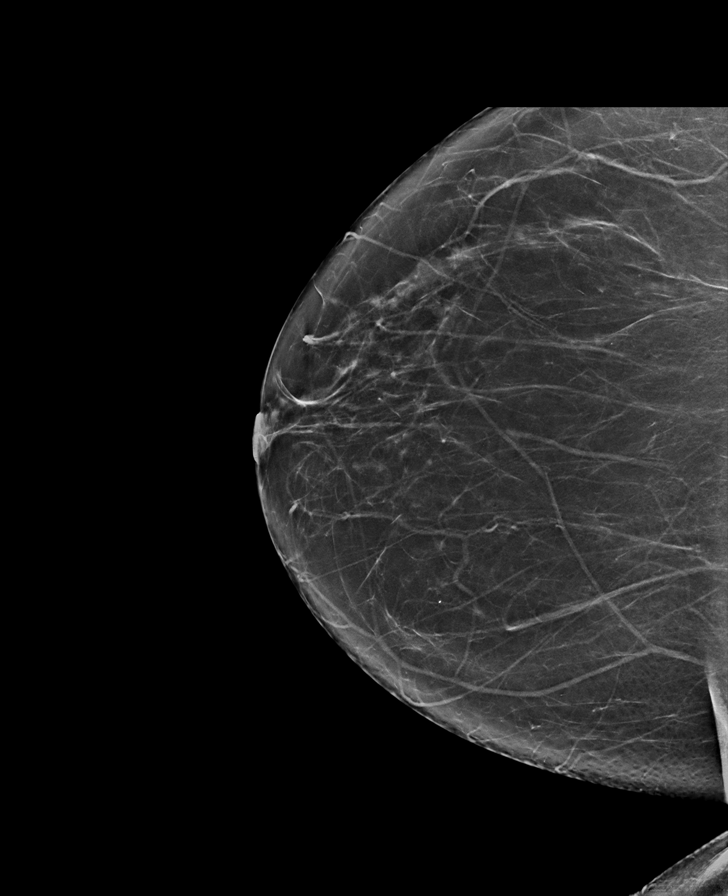

[R MLO synth-2D (1 of 2)]
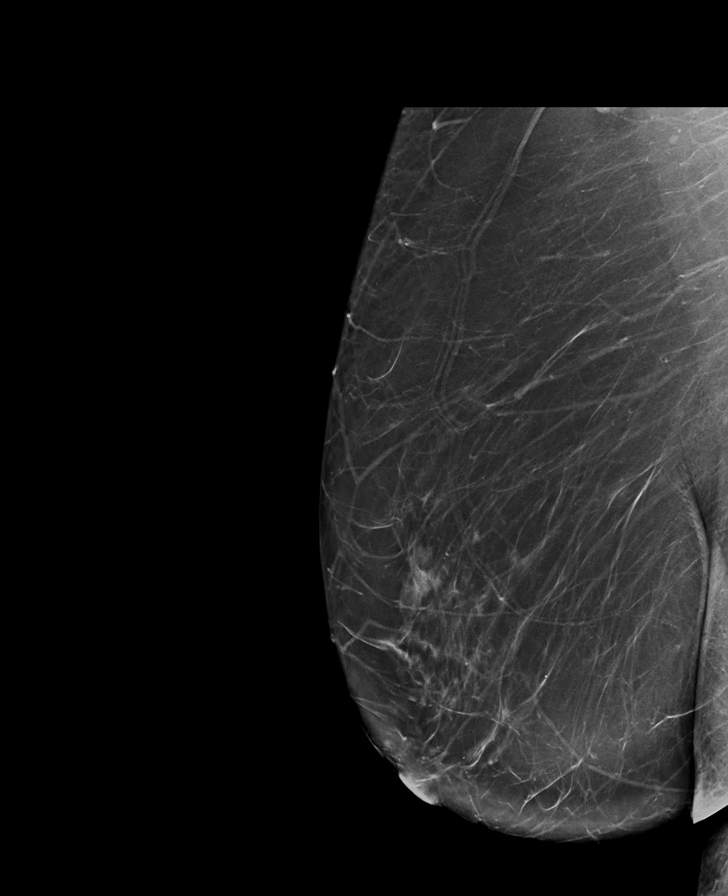

[L MLO synth-2D (1 of 3)]
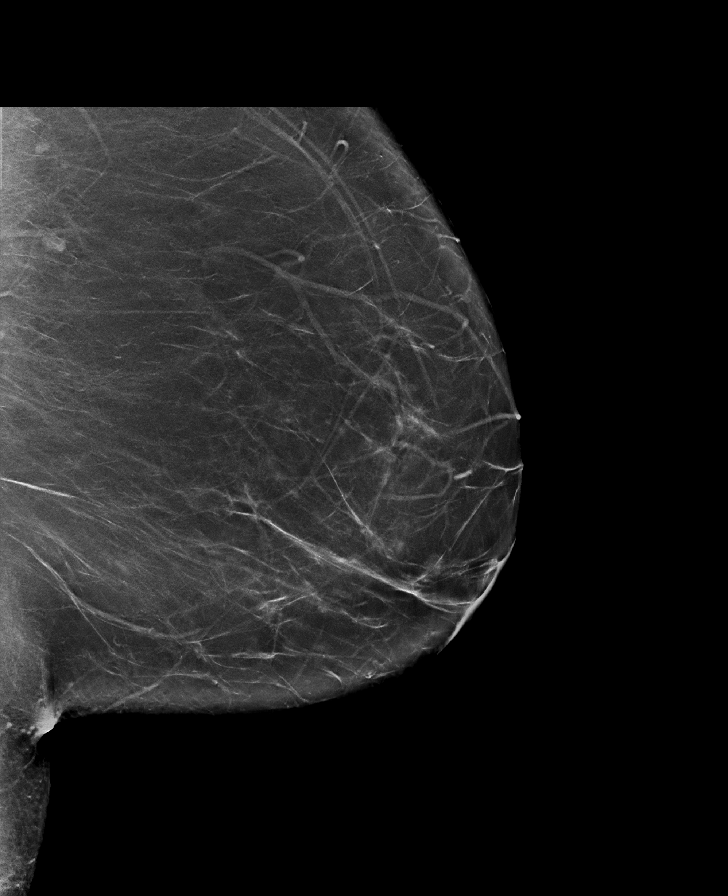

[L CC synth-2D]
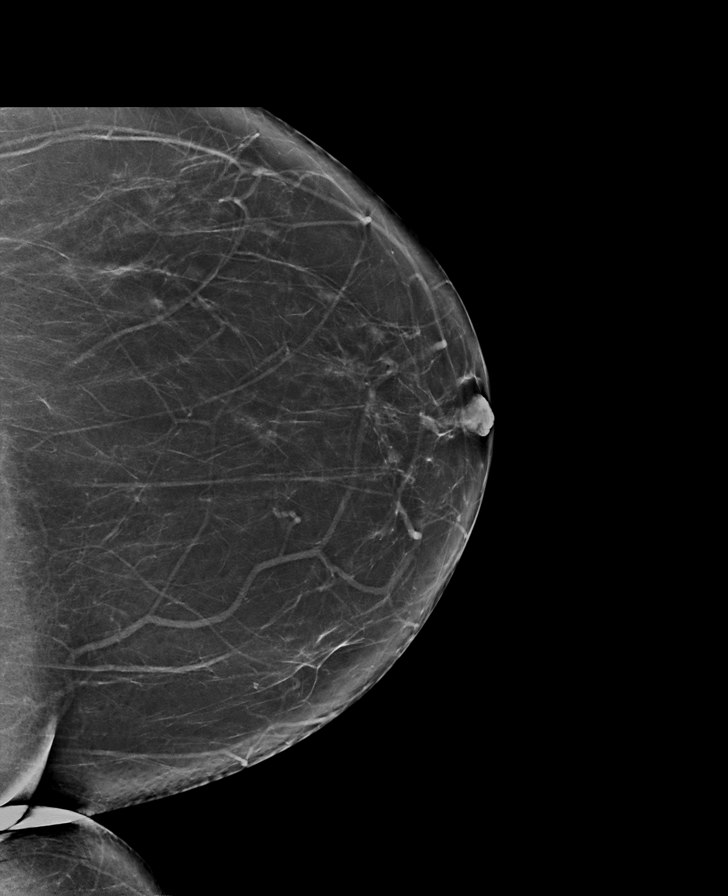

[R MLO synth-2D (2 of 2)]
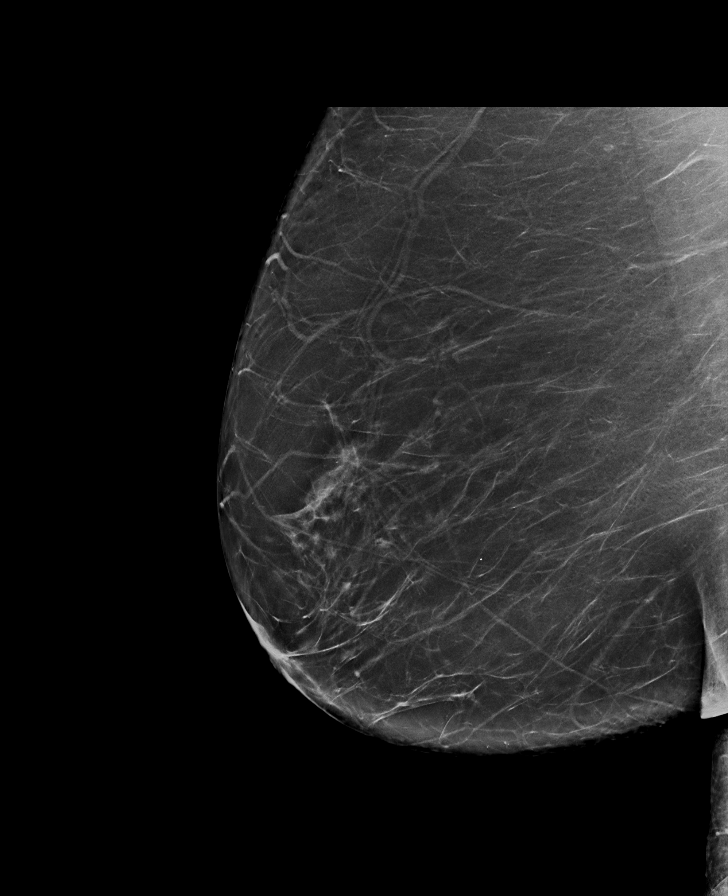

[L MLO synth-2D (2 of 3)]
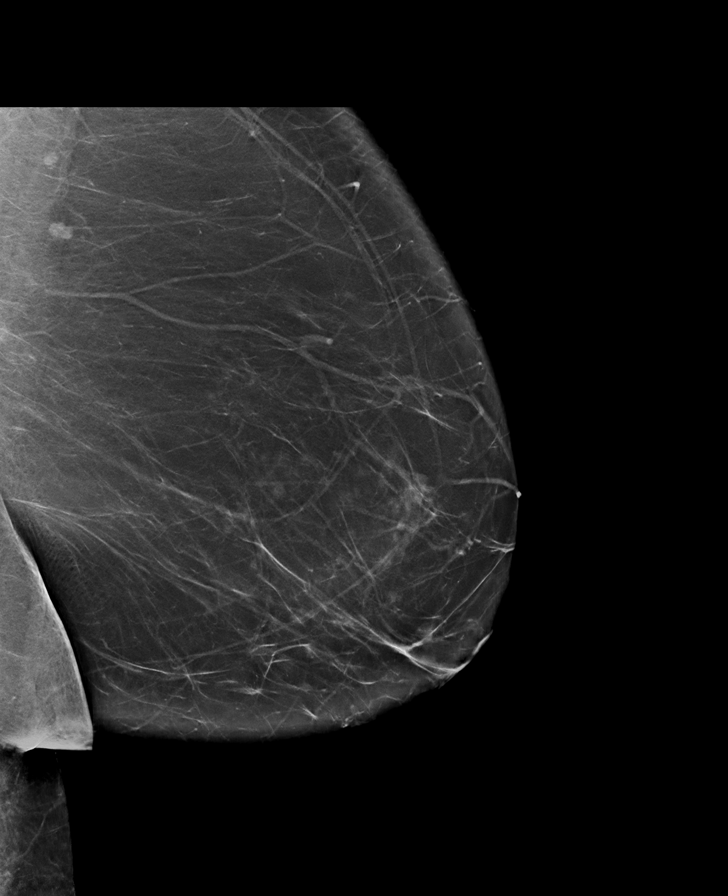

[L MLO synth-2D (3 of 3)]
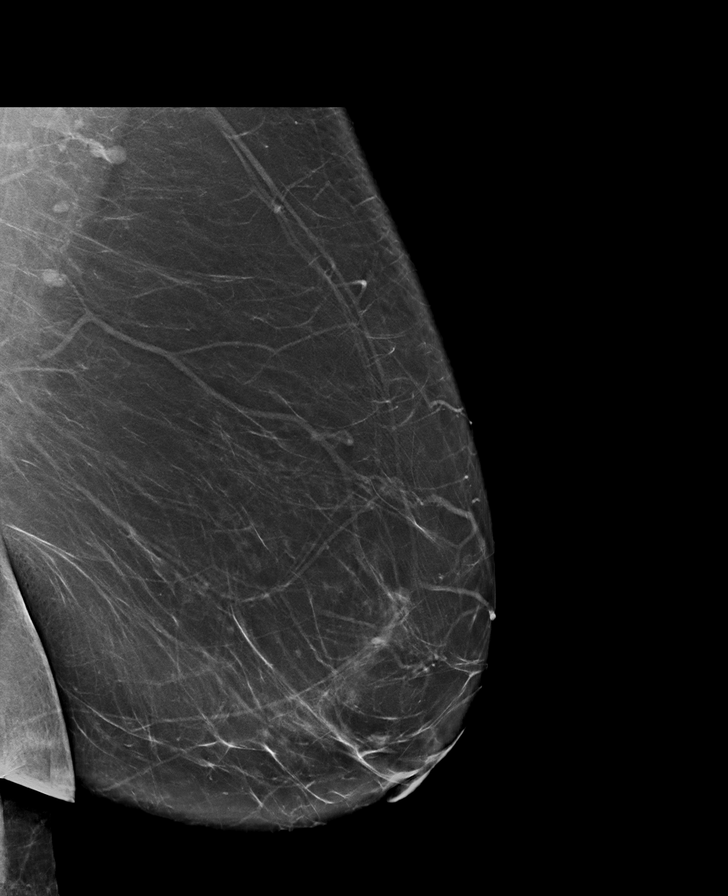

[L MLO tomo · tomo slice 47/92.0]
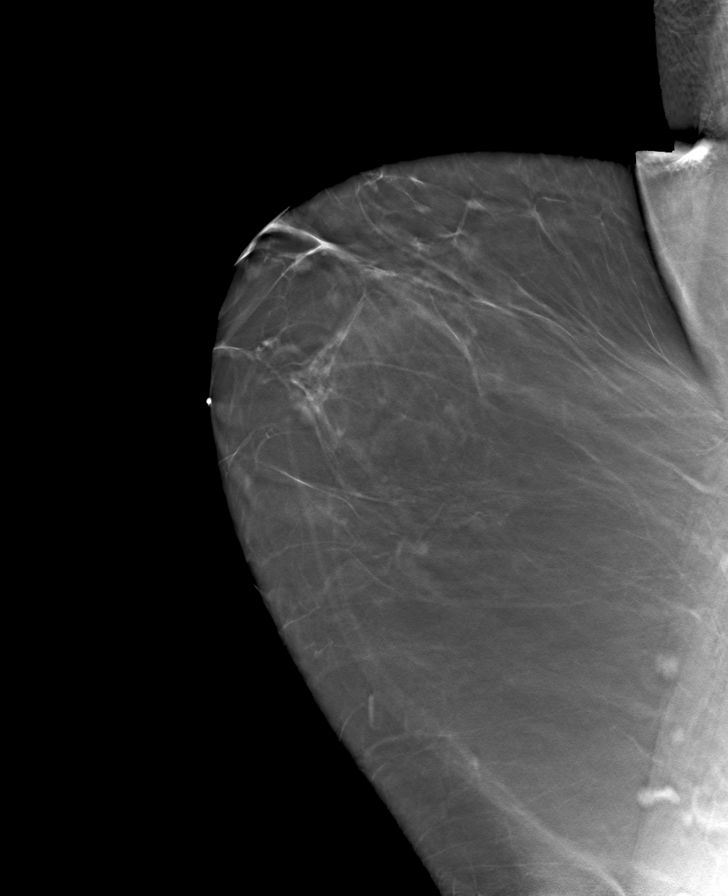

[8 of 40 positions shown; findings below may reference images not displayed]

FINDINGS: There are no findings suspicious for malignancy.
IMPRESSION: No mammographic evidence of malignancy. A result letter of this
screening mammogram will be mailed directly to the patient.

RECOMMENDATION:
Screening mammogram in one year. (Code:44-[id number])

BI-RADS CATEGORY  1: Negative.
